# Patient Record
Sex: Female | Born: 1975 | Race: Black or African American | Hispanic: No | Marital: Single | Smoking: Never smoker
Health system: Southern US, Community
[De-identification: ages and names within clinical notes are randomized; demographics above are authoritative.]

## PROBLEM LIST (undated history)

## (undated) ENCOUNTER — Emergency Department (HOSPITAL_COMMUNITY): Admission: EM | Payer: BLUE CROSS/BLUE SHIELD | Source: Home / Self Care

## (undated) DIAGNOSIS — A599 Trichomoniasis, unspecified: Secondary | ICD-10-CM

## (undated) DIAGNOSIS — J45909 Unspecified asthma, uncomplicated: Secondary | ICD-10-CM

## (undated) HISTORY — PX: HERNIA REPAIR: SHX51

## (undated) HISTORY — PX: CHOLECYSTECTOMY: SHX55

---

## 2015-07-20 ENCOUNTER — Emergency Department (HOSPITAL_COMMUNITY): Payer: Self-pay

## 2015-07-20 ENCOUNTER — Encounter (HOSPITAL_COMMUNITY): Payer: Self-pay

## 2015-07-20 ENCOUNTER — Emergency Department (HOSPITAL_COMMUNITY)
Admission: EM | Admit: 2015-07-20 | Discharge: 2015-07-20 | Disposition: A | Discharge status: Home / Self Care | Payer: Self-pay | Attending: Emergency Medicine | Admitting: Emergency Medicine

## 2015-07-20 DIAGNOSIS — R079 Chest pain, unspecified: Secondary | ICD-10-CM | POA: Insufficient documentation

## 2015-07-20 DIAGNOSIS — R630 Anorexia: Secondary | ICD-10-CM | POA: Insufficient documentation

## 2015-07-20 DIAGNOSIS — J45901 Unspecified asthma with (acute) exacerbation: Secondary | ICD-10-CM | POA: Insufficient documentation

## 2015-07-20 HISTORY — DX: Unspecified asthma, uncomplicated: J45.909

## 2015-07-20 LAB — CBC
HCT: 38.9 % (ref 36.0–46.0)
Hemoglobin: 12.8 g/dL (ref 12.0–15.0)
MCH: 33.6 pg (ref 26.0–34.0)
MCHC: 32.9 g/dL (ref 30.0–36.0)
MCV: 102.1 fL — ABNORMAL HIGH (ref 78.0–100.0)
PLATELETS: 289 10*3/uL (ref 150–400)
RBC: 3.81 MIL/uL — AB (ref 3.87–5.11)
RDW: 12.7 % (ref 11.5–15.5)
WBC: 7.4 10*3/uL (ref 4.0–10.5)

## 2015-07-20 LAB — I-STAT TROPONIN, ED
TROPONIN I, POC: 0.01 ng/mL (ref 0.00–0.08)
Troponin i, poc: 0 ng/mL (ref 0.00–0.08)

## 2015-07-20 LAB — BASIC METABOLIC PANEL
Anion gap: 8 (ref 5–15)
BUN: 17 mg/dL (ref 6–20)
CALCIUM: 9.6 mg/dL (ref 8.9–10.3)
CO2: 26 mmol/L (ref 22–32)
CREATININE: 0.96 mg/dL (ref 0.44–1.00)
Chloride: 107 mmol/L (ref 101–111)
GFR calc non Af Amer: >60 mL/min (ref >60)
Glucose, Bld: 83 mg/dL (ref 65–99)
Potassium: 4.1 mmol/L (ref 3.5–5.1)
SODIUM: 141 mmol/L (ref 135–145)

## 2015-07-20 MED ORDER — ALBUTEROL SULFATE HFA 108 (90 BASE) MCG/ACT IN AERS
2.0000 | INHALATION_SPRAY | Freq: Once | RESPIRATORY_TRACT | Status: AC
  Administered 2015-07-20: 2 via RESPIRATORY_TRACT
  Filled 2015-07-20: qty 6.7

## 2015-07-20 MED ORDER — METOCLOPRAMIDE HCL 5 MG/ML IJ SOLN
10.0000 mg | Freq: Once | INTRAMUSCULAR | Status: AC
  Administered 2015-07-20: 10 mg via INTRAMUSCULAR
  Filled 2015-07-20: qty 2

## 2015-07-20 MED ORDER — HYDROCODONE-ACETAMINOPHEN 5-325 MG PO TABS
2.0000 | ORAL_TABLET | Freq: Once | ORAL | Status: AC
  Administered 2015-07-20: 2 via ORAL
  Filled 2015-07-20: qty 2

## 2015-07-20 MED ORDER — DEXAMETHASONE SODIUM PHOSPHATE 10 MG/ML IJ SOLN
8.0000 mg | Freq: Once | INTRAMUSCULAR | Status: AC
  Administered 2015-07-20: 8 mg via INTRAMUSCULAR
  Filled 2015-07-20: qty 1

## 2015-07-20 NOTE — ED Provider Notes (Signed)
CSN: [id number]     Arrival date & time 07/20/15  1804 History   First MD Initiated Contact with Patient 07/20/15 1846     Chief Complaint  Patient presents with  . Chest Pain     (Consider location/radiation/quality/duration/timing/severity/associated sxs/prior Treatment) HPI Comments: 39 year old female with no significant medical history presents with anterior bilateral chest pain no radiation since 3:00 constant sharp. No pleuritic component, no classic blood clot risk factors, asthma history she says this feels similar. No respiratory distress. Patient does not have albuterol at home. No cough fevers or chills. No cardiac history. No exertional symptoms or leg swelling. No diaphoresis. Patient has had nausea.  Patient is a 39 y.o. female presenting with chest pain. The history is provided by the patient.  Chest Pain Associated symptoms: nausea   Associated symptoms: no abdominal pain, no back pain, no fever, no headache, no shortness of breath and not vomiting     Past Medical History  Diagnosis Date  . Asthma    History reviewed. No pertinent past surgical history. History reviewed. No pertinent family history. Social History  Substance Use Topics  . Smoking status: Never Smoker   . Smokeless tobacco: None  . Alcohol Use: No   OB History    No data available     Review of Systems  Constitutional: Positive for appetite change. Negative for fever and chills.  HENT: Negative for congestion.   Eyes: Negative for visual disturbance.  Respiratory: Negative for shortness of breath.   Cardiovascular: Positive for chest pain.  Gastrointestinal: Positive for nausea. Negative for vomiting and abdominal pain.  Genitourinary: Negative for dysuria and flank pain.  Musculoskeletal: Negative for back pain, neck pain and neck stiffness.  Skin: Negative for rash.  Neurological: Negative for light-headedness and headaches.      Allergies  Bactrim; Flagyl; Morphine and related;  Prednisone; and Zofran  Home Medications   Prior to Admission medications   Not on File   BP 119/80 mmHg  Pulse 88  Temp(Src) 98.2 F (36.8 C) (Oral)  Resp 16  Ht 5\' 8"  (1.727 m)  Wt 194 lb 0.6 oz (88.016 kg)  BMI 29.51 kg/m2  SpO2 100%  LMP 07/14/2015 Physical Exam  Constitutional: She is oriented to person, place, and time. She appears well-developed and well-nourished.  HENT:  Head: Normocephalic and atraumatic.  Eyes: Conjunctivae are normal. Right eye exhibits no discharge. Left eye exhibits no discharge.  Neck: Normal range of motion. Neck supple. No tracheal deviation present.  Cardiovascular: Normal rate, regular rhythm and intact distal pulses.   Pulmonary/Chest: Effort normal. She has wheezes (and expose wheeze).  Abdominal: Soft. She exhibits no distension. There is no tenderness. There is no guarding.  Musculoskeletal: She exhibits no edema.  Neurological: She is alert and oriented to person, place, and time.  Skin: Skin is warm. No rash noted.  Psychiatric: She has a normal mood and affect.  Nursing note and vitals reviewed.   ED Course  Procedures (including critical care time) Labs Review Labs Reviewed  BASIC METABOLIC PANEL  [place], ED    Imaging Review Dg Chest 2 View  07/20/2015  CLINICAL DATA:  Stabbing chest pain under both breasts for 3 hours. Initial encounter. EXAM: CHEST  2 VIEW COMPARISON:  None. FINDINGS: The heart size and mediastinal contours are normal. The lungs are clear. There is no pleural effusion or pneumothorax. No acute osseous findings are identified. Old rib fractures are noted on the left. EKG snap  overlies the upper right chest. IMPRESSION: No active cardiopulmonary process. Electronically Signed   By: Richardean Sale M.D.   On: 07/20/2015 18:37   I have personally reviewed and evaluated these images and lab results as part of my medical decision-making.   EKG Interpretation   Date/Time:  Sunday July 20 2015 18:09:50 EST Ventricular Rate:  73 PR Interval:  122 QRS Duration: 84 QT Interval:  366 QTC Calculation: 403 R Axis:   68 Text Interpretation:  Normal sinus rhythm with sinus arrhythmia Normal ECG  Confirmed by Cristalle Rohm  MD, Vannessa Godown ([id number]) on 07/20/2015 6:58:58 PM      MDM   Final diagnoses:  Asthma exacerbation  Chest pain, unspecified chest pain type   Patient presents with bilateral chest pain she says feels similar to asthma history. With chest pain plan for cardiac screen, patient low risk plan for delta troponin. Patient nauseous and one episode of vomiting in the ER and antiemetics given. Albuterol, steroids. EKG no acute findings, chest x-ray no acute findings. Blood work pending. PERC neg Results and differential diagnosis were discussed with the patient/parent/guardian. Xrays were independently reviewed by myself.  Close follow up outpatient was discussed, comfortable with the plan.   Medications  dexamethasone (DECADRON) injection 8 mg (8 mg Intramuscular Given 07/20/15 1925)  albuterol (PROVENTIL HFA;VENTOLIN HFA) 108 (90 BASE) MCG/ACT inhaler 2 puff (2 puffs Inhalation Given 07/20/15 1925)  HYDROcodone-acetaminophen (NORCO/VICODIN) 5-325 MG per tablet 2 tablet (2 tablets Oral Given 07/20/15 1924)  metoCLOPramide (REGLAN) injection 10 mg (10 mg Intramuscular Given 07/20/15 2000)    Filed Vitals:   07/20/15 1814 07/20/15 1900  BP: 114/96 119/80  Pulse: 88   Temp: 98.2 F (36.8 C)   TempSrc: Oral   Resp: 16   Height: 5\' 8"  (1.727 m)   Weight: 194 lb 0.6 oz (88.016 kg)   SpO2: 100% 100%    Final diagnoses:  None          Elnora Morrison, MD 07/24/15 [phone]

## 2015-07-20 NOTE — ED Notes (Signed)
Onset 3pm chest pain underneath bilateral breast and upper mid back.  Pain staying the same, constant.  Nothing makes better or worse.  No resp distress noted at triage.

## 2015-07-20 NOTE — Discharge Instructions (Signed)
If you were given medicines take as directed.  If you are on coumadin or contraceptives realize their levels and effectiveness is altered by many different medicines.  If you have any reaction (rash, tongues swelling, other) to the medicines stop taking and see a physician.    If your blood pressure was elevated in the ER make sure you follow up for management with a primary doctor or return for chest pain, shortness of breath or stroke symptoms.  Please follow up as directed and return to the ER or see a physician for new or worsening symptoms.  Thank you. Filed Vitals:   07/20/15 2000 07/20/15 2030 07/20/15 2100 07/20/15 2130  BP: 128/88 121/66 120/89 126/84  Pulse: 76 66 104 67  Temp:      TempSrc:      Resp:   19 20  Height:      Weight:      SpO2: 100% 100% 95% 97%

## 2015-08-25 ENCOUNTER — Encounter (HOSPITAL_COMMUNITY): Payer: Self-pay | Admitting: Emergency Medicine

## 2015-08-25 DIAGNOSIS — J45909 Unspecified asthma, uncomplicated: Secondary | ICD-10-CM | POA: Insufficient documentation

## 2015-08-25 DIAGNOSIS — R109 Unspecified abdominal pain: Secondary | ICD-10-CM | POA: Insufficient documentation

## 2015-08-25 LAB — CBC WITH DIFFERENTIAL/PLATELET
BASOS ABS: 0 10*3/uL (ref 0.0–0.1)
Basophils Relative: 1 %
EOS ABS: 0.2 10*3/uL (ref 0.0–0.7)
Eosinophils Relative: 3 %
HCT: 36.7 % (ref 36.0–46.0)
Hemoglobin: 12.2 g/dL (ref 12.0–15.0)
LYMPHS ABS: 3.4 10*3/uL (ref 0.7–4.0)
Lymphocytes Relative: 44 %
MCH: 34 pg (ref 26.0–34.0)
MCHC: 33.2 g/dL (ref 30.0–36.0)
MCV: 102.2 fL — AB (ref 78.0–100.0)
MONO ABS: 0.5 10*3/uL (ref 0.1–1.0)
MONOS PCT: 6 %
NEUTROS PCT: 46 %
Neutro Abs: 3.5 10*3/uL (ref 1.7–7.7)
PLATELETS: 241 10*3/uL (ref 150–400)
RBC: 3.59 MIL/uL — ABNORMAL LOW (ref 3.87–5.11)
RDW: 12.8 % (ref 11.5–15.5)
WBC: 7.6 10*3/uL (ref 4.0–10.5)

## 2015-08-25 NOTE — ED Notes (Signed)
Pt with hx of R inguinal hernia repair. Pt c.o pain in same area x 2 days with some diarrhea. Denies urinary/vaginal sx.

## 2015-08-26 ENCOUNTER — Emergency Department (HOSPITAL_COMMUNITY)
Admission: EM | Admit: 2015-08-26 | Discharge: 2015-08-26 | Disposition: A | Discharge status: Home / Self Care | Payer: Self-pay | Attending: Emergency Medicine | Admitting: Emergency Medicine

## 2015-08-26 ENCOUNTER — Emergency Department (HOSPITAL_COMMUNITY)
Admission: EM | Admit: 2015-08-26 | Discharge: 2015-08-26 | Discharge status: Against Medical Advice | Payer: Self-pay | Attending: Emergency Medicine | Admitting: Emergency Medicine

## 2015-08-26 ENCOUNTER — Emergency Department (HOSPITAL_COMMUNITY): Payer: Self-pay

## 2015-08-26 ENCOUNTER — Encounter (HOSPITAL_COMMUNITY): Payer: Self-pay | Admitting: Emergency Medicine

## 2015-08-26 DIAGNOSIS — J45909 Unspecified asthma, uncomplicated: Secondary | ICD-10-CM | POA: Insufficient documentation

## 2015-08-26 DIAGNOSIS — R109 Unspecified abdominal pain: Secondary | ICD-10-CM

## 2015-08-26 DIAGNOSIS — R197 Diarrhea, unspecified: Secondary | ICD-10-CM | POA: Insufficient documentation

## 2015-08-26 DIAGNOSIS — R1031 Right lower quadrant pain: Secondary | ICD-10-CM | POA: Insufficient documentation

## 2015-08-26 DIAGNOSIS — Z79899 Other long term (current) drug therapy: Secondary | ICD-10-CM | POA: Insufficient documentation

## 2015-08-26 DIAGNOSIS — R112 Nausea with vomiting, unspecified: Secondary | ICD-10-CM

## 2015-08-26 LAB — URINE MICROSCOPIC-ADD ON: WBC UA: NONE SEEN WBC/hpf (ref 0–5)

## 2015-08-26 LAB — COMPREHENSIVE METABOLIC PANEL
ALT: 10 U/L — AB (ref 14–54)
ANION GAP: 8 (ref 5–15)
AST: 17 U/L (ref 15–41)
Albumin: 3.6 g/dL (ref 3.5–5.0)
Alkaline Phosphatase: 57 U/L (ref 38–126)
BUN: 15 mg/dL (ref 6–20)
CALCIUM: 9.3 mg/dL (ref 8.9–10.3)
CHLORIDE: 108 mmol/L (ref 101–111)
CO2: 24 mmol/L (ref 22–32)
CREATININE: 0.96 mg/dL (ref 0.44–1.00)
Glucose, Bld: 85 mg/dL (ref 65–99)
Potassium: 4.2 mmol/L (ref 3.5–5.1)
Sodium: 140 mmol/L (ref 135–145)
Total Bilirubin: 0.4 mg/dL (ref 0.3–1.2)
Total Protein: 6.2 g/dL — ABNORMAL LOW (ref 6.5–8.1)

## 2015-08-26 LAB — URINALYSIS, ROUTINE W REFLEX MICROSCOPIC
BILIRUBIN URINE: NEGATIVE
Glucose, UA: NEGATIVE mg/dL
KETONES UR: NEGATIVE mg/dL
LEUKOCYTES UA: NEGATIVE
NITRITE: NEGATIVE
Protein, ur: NEGATIVE mg/dL
SPECIFIC GRAVITY, URINE: 1.03 (ref 1.005–1.030)
pH: 6 (ref 5.0–8.0)

## 2015-08-26 LAB — POC URINE PREG, ED: PREG TEST UR: NEGATIVE

## 2015-08-26 MED ORDER — PROMETHAZINE HCL 25 MG PO TABS: 25.0000 mg | ORAL_TABLET | Freq: Four times a day (QID) | ORAL | Status: DC | PRN

## 2015-08-26 MED ORDER — IOHEXOL 300 MG/ML  SOLN
100.0000 mL | Freq: Once | INTRAMUSCULAR | Status: AC | PRN
  Administered 2015-08-26: 100 mL via INTRAVENOUS

## 2015-08-26 MED ORDER — IOHEXOL 300 MG/ML  SOLN
25.0000 mL | Freq: Once | INTRAMUSCULAR | Status: AC | PRN
  Administered 2015-08-26: 25 mL via ORAL

## 2015-08-26 MED ORDER — SODIUM CHLORIDE 0.9 % IV BOLUS (SEPSIS)
500.0000 mL | Freq: Once | INTRAVENOUS | Status: AC
  Administered 2015-08-26: 500 mL via INTRAVENOUS

## 2015-08-26 MED ORDER — PROMETHAZINE HCL 25 MG/ML IJ SOLN
12.5000 mg | Freq: Once | INTRAMUSCULAR | Status: AC
  Administered 2015-08-26: 12.5 mg via INTRAVENOUS
  Filled 2015-08-26: qty 1

## 2015-08-26 NOTE — ED Notes (Signed)
Patient transported to CT 

## 2015-08-26 NOTE — ED Notes (Signed)
Bed: [id number] Expected date:  Expected time:  Means of arrival:  Comments: From [facility], agitation

## 2015-08-26 NOTE — ED Notes (Signed)
Pt was at [facility] earlier tonight and states they did blood work on her while she was there

## 2015-08-26 NOTE — Discharge Instructions (Signed)
Abdominal Pain, Adult Many things can cause abdominal pain. Usually, abdominal pain is not caused by a disease and will improve without treatment. It can often be observed and treated at home. Your health care provider will do a physical exam and possibly order blood tests and X-rays to help determine the seriousness of your pain. However, in many cases, more time must pass before a clear cause of the pain can be found. Before that point, your health care provider may not know if you need more testing or further treatment. HOME CARE INSTRUCTIONS Monitor your abdominal pain for any changes. The following actions may help to alleviate any discomfort you are experiencing:  Only take over-the-counter or prescription medicines as directed by your health care provider.  Do not take laxatives unless directed to do so by your health care provider.  Try a clear liquid diet (broth, tea, or water) as directed by your health care provider. Slowly move to a bland diet as tolerated. SEEK MEDICAL CARE IF:  You have unexplained abdominal pain.  You have abdominal pain associated with nausea or diarrhea.  You have pain when you urinate or have a bowel movement.  You experience abdominal pain that wakes you in the night.  You have abdominal pain that is worsened or improved by eating food.  You have abdominal pain that is worsened with eating fatty foods.  You have a fever. SEEK IMMEDIATE MEDICAL CARE IF:  Your pain does not go away within 2 hours.  You keep throwing up (vomiting).  Your pain is felt only in portions of the abdomen, such as the right side or the left lower portion of the abdomen.  You pass bloody or black tarry stools. MAKE SURE YOU:  Understand these instructions.  Will watch your condition.  Will get help right away if you are not doing well or get worse.   This information is not intended to replace advice given to you by your health care provider. Make sure you discuss  any questions you have with your health care provider.   Document Released: 05/26/2005 Document Revised: 05/07/2015 Document Reviewed: 04/25/2013 Elsevier Interactive Patient Education 2016 [place].  Diarrhea Diarrhea is frequent loose and watery bowel movements. It can cause you to feel weak and dehydrated. Dehydration can cause you to become tired and thirsty, have a dry mouth, and have decreased urination that often is dark yellow. Diarrhea is a sign of another problem, most often an infection that will not last long. In most cases, diarrhea typically lasts 2-3 days. However, it can last longer if it is a sign of something more serious. It is important to treat your diarrhea as directed by your caregiver to lessen or prevent future episodes of diarrhea. CAUSES  Some common causes include:  Gastrointestinal infections caused by viruses, bacteria, or parasites.  Food poisoning or food allergies.  Certain medicines, such as antibiotics, chemotherapy, and laxatives.  Artificial sweeteners and fructose.  Digestive disorders. HOME CARE INSTRUCTIONS  Ensure adequate fluid intake (hydration): Have 1 cup (8 oz) of fluid for each diarrhea episode. Avoid fluids that contain simple sugars or sports drinks, fruit juices, whole milk products, and sodas. Your urine should be clear or pale yellow if you are drinking enough fluids. Hydrate with an oral rehydration solution that you can purchase at pharmacies, retail stores, and online. You can prepare an oral rehydration solution at home by mixing the following ingredients together:   - tsp table salt.   tsp baking soda.  tsp salt substitute containing potassium chloride.  1  tablespoons sugar.  1 L (34 oz) of water.  Certain foods and beverages may increase the speed at which food moves through the gastrointestinal (GI) tract. These foods and beverages should be avoided and include:  Caffeinated and alcoholic beverages.  High-fiber  foods, such as raw fruits and vegetables, nuts, seeds, and whole grain breads and cereals.  Foods and beverages sweetened with sugar alcohols, such as xylitol, sorbitol, and mannitol.  Some foods may be well tolerated and may help thicken stool including:  Starchy foods, such as rice, toast, pasta, low-sugar cereal, oatmeal, grits, baked potatoes, crackers, and bagels.  Bananas.  Applesauce.  Add probiotic-rich foods to help increase healthy bacteria in the GI tract, such as yogurt and fermented milk products.  Wash your hands well after each diarrhea episode.  Only take over-the-counter or prescription medicines as directed by your caregiver.  Take a warm bath to relieve any burning or pain from frequent diarrhea episodes. SEEK IMMEDIATE MEDICAL CARE IF:   You are unable to keep fluids down.  You have persistent vomiting.  You have blood in your stool, or your stools are black and tarry.  You do not urinate in 6-8 hours, or there is only a small amount of very dark urine.  You have abdominal pain that increases or localizes.  You have weakness, dizziness, confusion, or light-headedness.  You have a severe headache.  Your diarrhea gets worse or does not get better.  You have a fever or persistent symptoms for more than 2-3 days.  You have a fever and your symptoms suddenly get worse. MAKE SURE YOU:   Understand these instructions.  Will watch your condition.  Will get help right away if you are not doing well or get worse.   This information is not intended to replace advice given to you by your health care provider. Make sure you discuss any questions you have with your health care provider.   Document Released: 08/06/2002 Document Revised: 09/06/2014 Document Reviewed: 04/23/2012 Elsevier Interactive Patient Education 2016 Elsevier Inc.  Nausea and Vomiting Nausea is a sick feeling that often comes before throwing up (vomiting). Vomiting is a reflex where  stomach contents come out of your mouth. Vomiting can cause severe loss of body fluids (dehydration). Children and elderly adults can become dehydrated quickly, especially if they also have diarrhea. Nausea and vomiting are symptoms of a condition or disease. It is important to find the cause of your symptoms. CAUSES   Direct irritation of the stomach lining. This irritation can result from increased acid production (gastroesophageal reflux disease), infection, food poisoning, taking certain medicines (such as nonsteroidal anti-inflammatory drugs), alcohol use, or tobacco use.  Signals from the brain.These signals could be caused by a headache, heat exposure, an inner ear disturbance, increased pressure in the brain from injury, infection, a tumor, or a concussion, pain, emotional stimulus, or metabolic problems.  An obstruction in the gastrointestinal tract (bowel obstruction).  Illnesses such as diabetes, hepatitis, gallbladder problems, appendicitis, kidney problems, cancer, sepsis, atypical symptoms of a heart attack, or eating disorders.  Medical treatments such as chemotherapy and radiation.  Receiving medicine that makes you sleep (general anesthetic) during surgery. DIAGNOSIS Your caregiver may ask for tests to be done if the problems do not improve after a few days. Tests may also be done if symptoms are severe or if the reason for the nausea and vomiting is not clear. Tests may include:  Urine tests.  Blood tests.  Stool tests.  Cultures (to look for evidence of infection).  X-rays or other imaging studies. Test results can help your caregiver make decisions about treatment or the need for additional tests. TREATMENT You need to stay well hydrated. Drink frequently but in small amounts.You may wish to drink water, sports drinks, clear broth, or eat frozen ice pops or gelatin dessert to help stay hydrated.When you eat, eating slowly may help prevent nausea.There are also some  antinausea medicines that may help prevent nausea. HOME CARE INSTRUCTIONS   Take all medicine as directed by your caregiver.  If you do not have an appetite, do not force yourself to eat. However, you must continue to drink fluids.  If you have an appetite, eat a normal diet unless your caregiver tells you differently.  Eat a variety of complex carbohydrates (rice, wheat, potatoes, bread), lean meats, yogurt, fruits, and vegetables.  Avoid high-fat foods because they are more difficult to digest.  Drink enough water and fluids to keep your urine clear or pale yellow.  If you are dehydrated, ask your caregiver for specific rehydration instructions. Signs of dehydration may include:  Severe thirst.  Dry lips and mouth.  Dizziness.  Dark urine.  Decreasing urine frequency and amount.  Confusion.  Rapid breathing or pulse. SEEK IMMEDIATE MEDICAL CARE IF:   You have blood or brown flecks (like coffee grounds) in your vomit.  You have black or bloody stools.  You have a severe headache or stiff neck.  You are confused.  You have severe abdominal pain.  You have chest pain or trouble breathing.  You do not urinate at least once every 8 hours.  You develop cold or clammy skin.  You continue to vomit for longer than 24 to 48 hours.  You have a fever. MAKE SURE YOU:   Understand these instructions.  Will watch your condition.  Will get help right away if you are not doing well or get worse.   This information is not intended to replace advice given to you by your health care provider. Make sure you discuss any questions you have with your health care provider.   Document Released: 08/16/2005 Document Revised: 11/08/2011 Document Reviewed: 01/13/2011 Elsevier Interactive Patient Education [employer].

## 2015-08-26 NOTE — ED Provider Notes (Signed)
CSN: [id number]     Arrival date & time 08/26/15  0056 History  By signing my name below, I, Emmanuella Mensah, attest that this documentation has been prepared under the direction and in the presence of Davonna Belling, MD. Electronically Signed: Judithann Sauger, ED Scribe. 08/26/2015. 2:03 AM.    Chief Complaint  Patient presents with  . Abdominal Pain  . Diarrhea   The history is provided by the patient. No language interpreter was used.   HPI Comments: Kelsey Maynard is a 39 y.o. female who presents to the Emergency Department complaining of gradually worsening intermittent abdominal pain that has become constant onset 2 days ago. She reports associated nausea and an episode of diarrhea yesterday. She adds that she has not been able to eat or drink anything today. She states that she had a hernia repair several years ago and she denies any problems at that area since. She states that her last menstrual cycle was 2 weeks and recently, there have been sporadic and frequent. She denies any sick contacts. Pt was at [facility] earlier tonight where she had blood work done. She reports an allergy to Morphine, Zofran, bactrim, flagyl, and prednisone. She denies any vomiting, vaginal discharge, or vaginal bleeding.   Past Medical History  Diagnosis Date  . Asthma    Past Surgical History  Procedure Laterality Date  . Hernia repair     History reviewed. No pertinent family history. Social History  Substance Use Topics  . Smoking status: Never Smoker   . Smokeless tobacco: None  . Alcohol Use: No   OB History    No data available     Review of Systems  Constitutional: Negative for fever.  Gastrointestinal: Positive for nausea, abdominal pain and diarrhea. Negative for vomiting.  Genitourinary: Negative for vaginal bleeding and vaginal discharge.  All other systems reviewed and are negative.     Allergies  Bactrim; Flagyl; Morphine and related; Prednisone; and Zofran  Home  Medications   Prior to Admission medications   Medication Sig Start Date End Date Taking? Authorizing Provider  albuterol (PROVENTIL HFA;VENTOLIN HFA) 108 (90 BASE) MCG/ACT inhaler Inhale 1 puff into the lungs every 6 (six) hours as needed for wheezing or shortness of breath.   Yes Historical Provider, MD  promethazine (PHENERGAN) 25 MG tablet Take 1 tablet (25 mg total) by mouth every 6 (six) hours as needed for nausea. 08/26/15   Davonna Belling, MD   BP 116/77 mmHg  Pulse 57  Temp(Src) 97.7 F (36.5 C) (Oral)  Resp 16  SpO2 100%  LMP 08/06/2015 Physical Exam  Constitutional: She is oriented to person, place, and time. She appears well-developed and well-nourished. No distress.  HENT:  Head: Normocephalic and atraumatic.  Eyes: Conjunctivae and EOM are normal.  Neck: Neck supple. No tracheal deviation present.  Cardiovascular: Normal rate.   Pulmonary/Chest: Effort normal. No respiratory distress.  Abdominal: She exhibits no mass. There is tenderness. There is no rebound and no guarding.  Right lower quadrant tenderness  Musculoskeletal: Normal range of motion.  Neurological: She is alert and oriented to person, place, and time.  Skin: Skin is warm and dry.  Psychiatric: She has a normal mood and affect. Her behavior is normal.  Nursing note and vitals reviewed.   ED Course  Procedures (including critical care time) DIAGNOSTIC STUDIES: Oxygen Saturation is 98% on RA, normal by my interpretation.    COORDINATION OF CARE: 2:01 AM- Pt advised of plan for treatment and pt agrees. Pt  will receive a CAT scan for further evaluation.    Labs Review Labs Reviewed - No data to display  Imaging Review Ct Abdomen Pelvis W Contrast  08/26/2015  CLINICAL DATA:  Acute onset of right lower quadrant abdominal pain and nausea. Diarrhea. Irregular periods. Initial encounter. EXAM: CT ABDOMEN AND PELVIS WITH CONTRAST TECHNIQUE: Multidetector CT imaging of the abdomen and pelvis was  performed using the standard protocol following bolus administration of intravenous contrast. CONTRAST:  155mL OMNIPAQUE IOHEXOL 300 MG/ML  SOLN COMPARISON:  None. FINDINGS: The visualized lung bases are clear. A tiny nonspecific 6 mm hypodensity is noted within the right hepatic lobe. The spleen is unremarkable in appearance. The patient is status post cholecystectomy, with clips noted at the gallbladder fossa. The pancreas and adrenal glands are unremarkable. The kidneys are unremarkable in appearance. There is no evidence of hydronephrosis. No renal or ureteral stones are seen. No perinephric stranding is appreciated. No free fluid is identified. The small bowel is unremarkable in appearance. 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 The stomach is within normal limits. No acute vascular abnormalities are seen. The appendix is normal in caliber and contains air, without evidence of appendicitis. Minimal diverticulosis is noted along the descending colon. The colon is otherwise unremarkable. The bladder is mildly distended and grossly unremarkable. The uterus is unremarkable in appearance. The ovaries are relatively symmetric. No suspicious adnexal masses are seen. No inguinal lymphadenopathy is seen. No acute osseous abnormalities are identified. IMPRESSION: 1. No acute abnormality seen within the abdomen or pelvis. 2. Minimal diverticulosis along the descending colon. Colon otherwise unremarkable. 3. Tiny nonspecific 6 mm hypodensity within the right hepatic lobe. 4. 1.8 cm duodenal diverticulum incidentally noted at the pancreatic head. Electronically Signed   By: Garald Balding M.D.   On: 08/26/2015 03:58     Davonna Belling, MD has personally reviewed and evaluated these images and lab results as part of his medical decision-making.  MDM   Final diagnoses:  Abdominal pain, unspecified abdominal location  Nausea vomiting and diarrhea    . Patient with  abdominal pain. Nausea vomiting diarrhea. Right lower quadrant tenderness. CT scan reassuring. Feels better will be discharged home. I personally performed the services described in this documentation, which was scribed in my presence. The recorded information has been reviewed and is accurate.      Davonna Belling, MD 08/26/15 [phone]

## 2015-08-26 NOTE — ED Notes (Signed)
Pt is c/o abd pain and diarrhea for the past 2 days  Pt states she has pain in her abdomen like something is playing tug of war and then has sharp pains like someone is stabbing her  Pt states pain is worse if she bends over or lays flat

## 2015-09-07 ENCOUNTER — Emergency Department (HOSPITAL_COMMUNITY)
Admission: EM | Admit: 2015-09-07 | Discharge: 2015-09-07 | Discharge status: Against Medical Advice | Payer: Self-pay | Attending: Emergency Medicine | Admitting: Emergency Medicine

## 2015-09-07 ENCOUNTER — Encounter (HOSPITAL_COMMUNITY): Payer: Self-pay | Admitting: *Deleted

## 2015-09-07 DIAGNOSIS — R197 Diarrhea, unspecified: Secondary | ICD-10-CM | POA: Insufficient documentation

## 2015-09-07 DIAGNOSIS — J45909 Unspecified asthma, uncomplicated: Secondary | ICD-10-CM | POA: Insufficient documentation

## 2015-09-07 DIAGNOSIS — Z3202 Encounter for pregnancy test, result negative: Secondary | ICD-10-CM | POA: Insufficient documentation

## 2015-09-07 DIAGNOSIS — N939 Abnormal uterine and vaginal bleeding, unspecified: Secondary | ICD-10-CM | POA: Insufficient documentation

## 2015-09-07 DIAGNOSIS — R1030 Lower abdominal pain, unspecified: Secondary | ICD-10-CM | POA: Insufficient documentation

## 2015-09-07 LAB — URINALYSIS, ROUTINE W REFLEX MICROSCOPIC
Bilirubin Urine: NEGATIVE
Glucose, UA: NEGATIVE mg/dL
Ketones, ur: NEGATIVE mg/dL
LEUKOCYTES UA: NEGATIVE
NITRITE: NEGATIVE
PROTEIN: NEGATIVE mg/dL
SPECIFIC GRAVITY, URINE: 1.027 (ref 1.005–1.030)
pH: 6 (ref 5.0–8.0)

## 2015-09-07 LAB — CBC
HCT: 45 % (ref 36.0–46.0)
HEMOGLOBIN: 15 g/dL (ref 12.0–15.0)
MCH: 34.6 pg — AB (ref 26.0–34.0)
MCHC: 33.3 g/dL (ref 30.0–36.0)
MCV: 103.9 fL — ABNORMAL HIGH (ref 78.0–100.0)
PLATELETS: 268 10*3/uL (ref 150–400)
RBC: 4.33 MIL/uL (ref 3.87–5.11)
RDW: 12.5 % (ref 11.5–15.5)
WBC: 7.4 10*3/uL (ref 4.0–10.5)

## 2015-09-07 LAB — URINE MICROSCOPIC-ADD ON

## 2015-09-07 LAB — COMPREHENSIVE METABOLIC PANEL
ALK PHOS: 73 U/L (ref 38–126)
ALT: 16 U/L (ref 14–54)
ANION GAP: 11 (ref 5–15)
AST: 24 U/L (ref 15–41)
Albumin: 4.5 g/dL (ref 3.5–5.0)
BILIRUBIN TOTAL: 0.6 mg/dL (ref 0.3–1.2)
BUN: 16 mg/dL (ref 6–20)
CALCIUM: 9.6 mg/dL (ref 8.9–10.3)
CO2: 22 mmol/L (ref 22–32)
CREATININE: 0.97 mg/dL (ref 0.44–1.00)
Chloride: 107 mmol/L (ref 101–111)
Glucose, Bld: 84 mg/dL (ref 65–99)
Potassium: 3.8 mmol/L (ref 3.5–5.1)
Sodium: 140 mmol/L (ref 135–145)
TOTAL PROTEIN: 7.8 g/dL (ref 6.5–8.1)

## 2015-09-07 LAB — POC URINE PREG, ED: Preg Test, Ur: NEGATIVE

## 2015-09-07 LAB — LIPASE, BLOOD: Lipase: 29 U/L (ref 11–51)

## 2015-09-07 NOTE — ED Notes (Signed)
Pt states she would like to leave AMA.  It was explained to that Dr. Roxanne Mins was signed up to see her and would likely be in within the next 10 minutes. Pt was also made aware of the risk of leaving.  Pt expressed understanding and still desired to leave AMA.  Dr. Roxanne Mins made aware.

## 2015-09-07 NOTE — ED Notes (Addendum)
Pt complains of lower abdominal pain and diarrhea since last night. Pt had a miscarriage, then dilation and curettage 3 weeks ago. Pt states she started bleeding lightly last night, but states her period is due at the end of this month. Pt states she took ibuprofen, which did not provide relief. Pt states she has also been urinating more frequently, denies odor or burning.

## 2015-10-01 ENCOUNTER — Emergency Department (HOSPITAL_COMMUNITY)
Admission: EM | Admit: 2015-10-01 | Discharge: 2015-10-02 | Disposition: A | Discharge status: Home / Self Care | Payer: Self-pay | Attending: Emergency Medicine | Admitting: Emergency Medicine

## 2015-10-01 ENCOUNTER — Encounter (HOSPITAL_COMMUNITY): Payer: Self-pay | Admitting: Emergency Medicine

## 2015-10-01 DIAGNOSIS — R1110 Vomiting, unspecified: Secondary | ICD-10-CM | POA: Insufficient documentation

## 2015-10-01 DIAGNOSIS — N898 Other specified noninflammatory disorders of vagina: Secondary | ICD-10-CM | POA: Insufficient documentation

## 2015-10-01 DIAGNOSIS — R197 Diarrhea, unspecified: Secondary | ICD-10-CM | POA: Insufficient documentation

## 2015-10-01 DIAGNOSIS — J45909 Unspecified asthma, uncomplicated: Secondary | ICD-10-CM | POA: Insufficient documentation

## 2015-10-01 LAB — CBC WITH DIFFERENTIAL/PLATELET
Basophils Absolute: 0 10*3/uL (ref 0.0–0.1)
Basophils Relative: 1 %
EOS ABS: 0.1 10*3/uL (ref 0.0–0.7)
EOS PCT: 2 %
HEMATOCRIT: 40.2 % (ref 36.0–46.0)
Hemoglobin: 13.5 g/dL (ref 12.0–15.0)
Lymphocytes Relative: 41 %
Lymphs Abs: 2.5 10*3/uL (ref 0.7–4.0)
MCH: 33.8 pg (ref 26.0–34.0)
MCHC: 33.6 g/dL (ref 30.0–36.0)
MCV: 100.8 fL — ABNORMAL HIGH (ref 78.0–100.0)
MONO ABS: 0.3 10*3/uL (ref 0.1–1.0)
MONOS PCT: 5 %
Neutro Abs: 3 10*3/uL (ref 1.7–7.7)
Neutrophils Relative %: 51 %
Platelets: 235 10*3/uL (ref 150–400)
RBC: 3.99 MIL/uL (ref 3.87–5.11)
RDW: 12.3 % (ref 11.5–15.5)
WBC: 6 10*3/uL (ref 4.0–10.5)

## 2015-10-01 LAB — COMPREHENSIVE METABOLIC PANEL
ALT: 14 U/L (ref 14–54)
AST: 23 U/L (ref 15–41)
Albumin: 3.9 g/dL (ref 3.5–5.0)
Alkaline Phosphatase: 67 U/L (ref 38–126)
Anion gap: 8 (ref 5–15)
BILIRUBIN TOTAL: 0.4 mg/dL (ref 0.3–1.2)
BUN: 15 mg/dL (ref 6–20)
CALCIUM: 9.3 mg/dL (ref 8.9–10.3)
CO2: 22 mmol/L (ref 22–32)
CREATININE: 0.93 mg/dL (ref 0.44–1.00)
Chloride: 110 mmol/L (ref 101–111)
GFR calc Af Amer: >60 mL/min (ref >60)
Glucose, Bld: 117 mg/dL — ABNORMAL HIGH (ref 65–99)
Potassium: 4.2 mmol/L (ref 3.5–5.1)
Sodium: 140 mmol/L (ref 135–145)
TOTAL PROTEIN: 6.9 g/dL (ref 6.5–8.1)

## 2015-10-01 LAB — LIPASE, BLOOD: Lipase: 37 U/L (ref 11–51)

## 2015-10-01 LAB — I-STAT BETA HCG BLOOD, ED (MC, WL, AP ONLY)

## 2015-10-01 NOTE — ED Notes (Signed)
Pt came to desk and stated that she wanted to leave. When asked if she was sure she wanted to leave pt just walked out.

## 2015-10-01 NOTE — ED Notes (Signed)
Pt reports lower abdominal pain, thick white vaginal discharge that has an odor for four days. Three days of diarrhea and vomiting. Pt reports emesis X 5 in the past 24 hours. Pt reports unable to drink fluids today without feeling nauseated.  LMP second week of January. Pt reports her period was irregular last time.

## 2015-10-30 ENCOUNTER — Emergency Department (HOSPITAL_COMMUNITY)
Admission: EM | Admit: 2015-10-30 | Discharge: 2015-10-30 | Disposition: A | Discharge status: Home / Self Care | Payer: Self-pay | Attending: Emergency Medicine | Admitting: Emergency Medicine

## 2015-10-30 ENCOUNTER — Encounter (HOSPITAL_COMMUNITY): Payer: Self-pay | Admitting: Emergency Medicine

## 2015-10-30 DIAGNOSIS — R112 Nausea with vomiting, unspecified: Secondary | ICD-10-CM | POA: Insufficient documentation

## 2015-10-30 DIAGNOSIS — Z3202 Encounter for pregnancy test, result negative: Secondary | ICD-10-CM | POA: Insufficient documentation

## 2015-10-30 DIAGNOSIS — J45909 Unspecified asthma, uncomplicated: Secondary | ICD-10-CM | POA: Insufficient documentation

## 2015-10-30 LAB — URINALYSIS, ROUTINE W REFLEX MICROSCOPIC
BILIRUBIN URINE: NEGATIVE
GLUCOSE, UA: NEGATIVE mg/dL
Ketones, ur: NEGATIVE mg/dL
Leukocytes, UA: NEGATIVE
Nitrite: NEGATIVE
PH: 5.5 (ref 5.0–8.0)
Protein, ur: NEGATIVE mg/dL
SPECIFIC GRAVITY, URINE: 1.022 (ref 1.005–1.030)

## 2015-10-30 LAB — COMPREHENSIVE METABOLIC PANEL
ALBUMIN: 3.7 g/dL (ref 3.5–5.0)
ALT: 18 U/L (ref 14–54)
AST: 27 U/L (ref 15–41)
Alkaline Phosphatase: 60 U/L (ref 38–126)
Anion gap: 7 (ref 5–15)
BUN: 13 mg/dL (ref 6–20)
CALCIUM: 9 mg/dL (ref 8.9–10.3)
CHLORIDE: 111 mmol/L (ref 101–111)
CO2: 22 mmol/L (ref 22–32)
Creatinine, Ser: 0.96 mg/dL (ref 0.44–1.00)
GFR calc Af Amer: >60 mL/min (ref >60)
GFR calc non Af Amer: >60 mL/min (ref >60)
GLUCOSE: 86 mg/dL (ref 65–99)
Potassium: 4.8 mmol/L (ref 3.5–5.1)
Sodium: 140 mmol/L (ref 135–145)
Total Bilirubin: 1.1 mg/dL (ref 0.3–1.2)
Total Protein: 6.6 g/dL (ref 6.5–8.1)

## 2015-10-30 LAB — LIPASE, BLOOD: LIPASE: 35 U/L (ref 11–51)

## 2015-10-30 LAB — I-STAT BETA HCG BLOOD, ED (MC, WL, AP ONLY): I-stat hCG, quantitative: <5 m[IU]/mL (ref <5)

## 2015-10-30 LAB — CBC
HEMATOCRIT: 39.1 % (ref 36.0–46.0)
HEMOGLOBIN: 13.3 g/dL (ref 12.0–15.0)
MCH: 34.3 pg — AB (ref 26.0–34.0)
MCHC: 34 g/dL (ref 30.0–36.0)
MCV: 100.8 fL — AB (ref 78.0–100.0)
Platelets: 241 10*3/uL (ref 150–400)
RBC: 3.88 MIL/uL (ref 3.87–5.11)
RDW: 12.6 % (ref 11.5–15.5)
WBC: 6.2 10*3/uL (ref 4.0–10.5)

## 2015-10-30 LAB — URINE MICROSCOPIC-ADD ON

## 2015-10-30 MED ORDER — SODIUM CHLORIDE 0.9 % IV BOLUS (SEPSIS)
1000.0000 mL | Freq: Once | INTRAVENOUS | Status: AC
  Administered 2015-10-30: 1000 mL via INTRAVENOUS

## 2015-10-30 MED ORDER — PROMETHAZINE HCL 25 MG/ML IJ SOLN
25.0000 mg | Freq: Once | INTRAMUSCULAR | Status: AC
  Administered 2015-10-30: 25 mg via INTRAVENOUS
  Filled 2015-10-30: qty 1

## 2015-10-30 MED ORDER — PROMETHAZINE HCL 25 MG/ML IJ SOLN: 12.5000 mg | Freq: Once | INTRAMUSCULAR | Status: DC

## 2015-10-30 MED ORDER — PROMETHAZINE HCL 25 MG PO TABS: 25.0000 mg | ORAL_TABLET | Freq: Four times a day (QID) | ORAL | Status: DC | PRN

## 2015-10-30 NOTE — ED Notes (Signed)
Pt from home with c/o emesis 3-4 times a day since this past Sunday.  Pt states she is unable to tolerate PO intake.  Denies abdominal pain or diarrhea.  NAD, A&O.

## 2015-10-30 NOTE — ED Provider Notes (Signed)
CSN: [id number]     Arrival date & time 10/30/15  [id number] History   First MD Initiated Contact with Patient 10/30/15 0930     Chief Complaint  Patient presents with  . Emesis    HPI   Kelsey Maynard is a 40 y.o. female with a PMH of asthma who presents to the ED with vomiting, which she states started on Monday. She reports she has been vomiting 3-4 times per day. She denies hematemesis. She reports eating or drinking precipitates her symptoms. She has not tried anything for symptom relief. She denies fever, chills, abdominal pain, diarrhea, constipation, dysuria, urgency, frequency.   Past Medical History  Diagnosis Date  . Asthma    Past Surgical History  Procedure Laterality Date  . Hernia repair     History reviewed. No pertinent family history. Social History  Substance Use Topics  . Smoking status: Never Smoker   . Smokeless tobacco: Never Used  . Alcohol Use: No   OB History    No data available      Review of Systems  Constitutional: Negative for fever and chills.  Gastrointestinal: Positive for nausea and vomiting. Negative for abdominal pain, diarrhea and constipation.  Genitourinary: Negative for dysuria, urgency and frequency.  All other systems reviewed and are negative.     Allergies  Bactrim; Flagyl; Morphine and related; Prednisone; and Zofran  Home Medications   Prior to Admission medications   Medication Sig Start Date End Date Taking? Authorizing Provider  albuterol (PROVENTIL HFA;VENTOLIN HFA) 108 (90 BASE) MCG/ACT inhaler Inhale 1 puff into the lungs every 6 (six) hours as needed for wheezing or shortness of breath.   Yes Historical Provider, MD  promethazine (PHENERGAN) 25 MG tablet Take 1 tablet (25 mg total) by mouth every 6 (six) hours as needed for nausea or vomiting. 10/30/15   Marella Chimes, PA-C    BP 121/71 mmHg  Pulse 70  Temp(Src) 98.1 F (36.7 C) (Oral)  Resp 18  Ht 5\' 8"  (1.727 m)  Wt 89.359 kg  BMI 29.96 kg/m2  SpO2 100%   LMP 10/30/2015 (Exact Date) Physical Exam  Constitutional: She is oriented to person, place, and time. She appears well-developed and well-nourished. No distress.  HENT:  Head: Normocephalic and atraumatic.  Right Ear: External ear normal.  Left Ear: External ear normal.  Nose: Nose normal.  Mouth/Throat: Uvula is midline, oropharynx is clear and moist and mucous membranes are normal.  Eyes: Conjunctivae, EOM and lids are normal. Pupils are equal, round, and reactive to light. Right eye exhibits no discharge. Left eye exhibits no discharge. No scleral icterus.  Neck: Normal range of motion. Neck supple.  Cardiovascular: Normal rate, regular rhythm, normal heart sounds, intact distal pulses and normal pulses.   Pulmonary/Chest: Effort normal and breath sounds normal. No respiratory distress. She has no wheezes. She has no rales.  Abdominal: Soft. Normal appearance and bowel sounds are normal. She exhibits no distension and no mass. There is no tenderness. There is no rigidity, no rebound, no guarding and no CVA tenderness.  Musculoskeletal: Normal range of motion. She exhibits no edema or tenderness.  Neurological: She is alert and oriented to person, place, and time.  Skin: Skin is warm, dry and intact. No rash noted. She is not diaphoretic. No erythema. No pallor.  Psychiatric: She has a normal mood and affect. Her speech is normal and behavior is normal.  Nursing note and vitals reviewed.   ED Course  Procedures (including critical care  time)  Labs Review Labs Reviewed  CBC - Abnormal; Notable for the following:    MCV 100.8 (*)    MCH 34.3 (*)    All other components within normal limits  URINALYSIS, ROUTINE W REFLEX MICROSCOPIC (NOT AT [facility]) - Abnormal; Notable for the following:    APPearance HAZY (*)    Hgb urine dipstick LARGE (*)    All other components within normal limits  URINE MICROSCOPIC-ADD ON - Abnormal; Notable for the following:    Squamous Epithelial / LPF 6-30  (*)    Bacteria, UA FEW (*)    All other components within normal limits  LIPASE, BLOOD  COMPREHENSIVE METABOLIC PANEL  I-STAT BETA HCG BLOOD, ED ([facility], WL, AP ONLY)    Imaging Review No results found. I have personally reviewed and evaluated these images and lab results as part of my medical decision-making.   EKG Interpretation None      MDM   Final diagnoses:  Non-intractable vomiting with nausea, vomiting of unspecified type    40 year old female presents with vomiting since Monday. Denies hematemesis, fever, chills, abdominal pain, diarrhea, constipation, dysuria, urgency, frequency.  Patient is afebrile. Vital signs stable. Heart regular rate and rhythm. Lungs clear to auscultation bilaterally. Abdomen soft, nontender, nondistended. No CVA tenderness.  Will give fluids and phenergan. Labs pending.   CBC negative for leukocytosis or anemia. CMP unremarkable. Lipase within normal limits. Beta hCG negative. UA remarkable for large hemoglobin, though patient states she is currently on her menstrual cycle.  On reassessment of patient, she reports symptom improvement and is able to tolerate PO intake. Patient is nontoxic and well-appearing, feel she is stable for discharge at this time. Will give phenergan for nausea for home, as patient has an allergy to zofran. Patient to follow-up with PCP. Strict return precautions discussed. Patient verbalizes her understanding and is in agreement with plan.  BP 121/71 mmHg  Pulse 70  Temp(Src) 98.1 F (36.7 C) (Oral)  Resp 18  Ht 5\' 8"  (1.727 m)  Wt 89.359 kg  BMI 29.96 kg/m2  SpO2 100%  LMP 10/30/2015 (Exact Date)    Marella Chimes, PA-C 10/30/15 1156  Wandra Arthurs, MD 10/30/15 [phone]

## 2015-10-30 NOTE — Discharge Instructions (Signed)
1. Medications: phenergan for nausea, usual home medications 2. Treatment: rest, drink plenty of fluids  3. Follow Up: please followup with your primary doctor for discussion of your diagnoses and further evaluation after today's visit; if you do not have a primary care doctor use the resource guide provided to find one; please return to the ER for high fever, persistent vomiting, new or worsening symptoms    Nausea and Vomiting Nausea means you feel sick to your stomach. Throwing up (vomiting) is a reflex where stomach contents come out of your mouth. HOME CARE   Take medicine as told by your doctor.  Do not force yourself to eat. However, you do need to drink fluids.  If you feel like eating, eat a normal diet as told by your doctor.  Eat rice, wheat, potatoes, bread, lean meats, yogurt, fruits, and vegetables.  Avoid high-fat foods.  Drink enough fluids to keep your pee (urine) clear or pale yellow.  Ask your doctor how to replace body fluid losses (rehydrate). Signs of body fluid loss (dehydration) include:  Feeling very thirsty.  Dry lips and mouth.  Feeling dizzy.  Dark pee.  Peeing less than normal.  Feeling confused.  Fast breathing or heart rate. GET HELP RIGHT AWAY IF:   You have blood in your throw up.  You have black or bloody poop (stool).  You have a bad headache or stiff neck.  You feel confused.  You have bad belly (abdominal) pain.  You have chest pain or trouble breathing.  You do not pee at least once every 8 hours.  You have cold, clammy skin.  You keep throwing up after 24 to 48 hours.  You have a fever. MAKE SURE YOU:   Understand these instructions.  Will watch your condition.  Will get help right away if you are not doing well or get worse.   This information is not intended to replace advice given to you by your health care provider. Make sure you discuss any questions you have with your health care provider.   Document  Released: 02/02/2008 Document Revised: 11/08/2011 Document Reviewed: 01/15/2011 Elsevier Interactive Patient Education 2016 [employer].   Emergency Department Resource Guide 1) Find a Doctor and Pay Out of Pocket Although you won't have to find out who is covered by your insurance plan, it is a good idea to ask around and get recommendations. You will then need to call the office and see if the doctor you have chosen will accept you as a new patient and what types of options they offer for patients who are self-pay. Some doctors offer discounts or will set up payment plans for their patients who do not have insurance, but you will need to ask so you aren't surprised when you get to your appointment.  2) Contact Your Local Health Department Not all health departments have doctors that can see patients for sick visits, but many do, so it is worth a call to see if yours does. If you don't know where your local health department is, you can check in your phone book. The CDC also has a tool to help you locate your state's health department, and many state websites also have listings of all of their local health departments.  3) Find a [facility] If your illness is not likely to be very severe or complicated, you may want to try a walk in clinic. These are popping up all over the country in pharmacies, drugstores, and shopping centers.  They're usually staffed by nurse practitioners or physician assistants that have been trained to treat common illnesses and complaints. They're usually fairly quick and inexpensive. However, if you have serious medical issues or chronic medical problems, these are probably not your best option.  No Primary Care Doctor: - Call Health Connect at  [phone] - they can help you locate a primary care doctor that  accepts your insurance, provides certain services, etc. - Physician Referral Service- [phone]  Chronic Pain Problems: Organization          Address  Phone   Notes  [facility]  [phone] Patients need to be referred by their primary care doctor.   Medication Assistance: Organization         Address  Phone   Notes  [facility] Medication [facility] [address] [place], [place] [address] [phone] --Must be a resident of [facility] -- Must have NO insurance coverage whatsoever (no Medicaid/ Medicare, etc.) -- The pt. MUST have a primary care doctor that directs their care regularly and follows them in the community   MedAssist  [phone]   [employer]  [phone]    Agencies that provide inexpensive medical care: Organization         Address  Phone   Notes  [place]  [phone]   [facility]    [phone]   [facility] [place], [place] [address] [phone]   [place] [address] [place] [phone]   Planned Parenthood    [phone]   [facility]    [phone]   [place] and [address] [place], [place] Phone:  [phone], Fax:  [phone] Hours of Operation:  9 am - 6 pm, M-F.  Also accepts Medicaid/Medicare and self-pay.  [facility] for [place], [address], [place] Phone: [phone], Fax: [phone]. Hours of Operation:  8:30 am - 5:30 pm, M-F.  Also accepts Medicaid and self-pay.  [facility] [place] [address], [place] Phone: [phone]   [place], [place], [place] [phone], Ext. [phone] Mondays & Thursdays: 7-9 AM.  First 15 patients are seen on a first come, first serve basis.    [place] Providers:  Organization         Address  Phone   Notes  [facility] [address], [place] [phone] Also accepts self-pay patients.  [facility] [id number] [place], [place]  [phone]   [place], [address], [place] [phone]   [facility] [address], [place] [phone]   Lucianne Lei [address], [place]   [phone] Only accepts [place] Access [place] patients after they have their name applied to their card.   Self-Pay (no insurance) in [facility]:  Organization         Address  Phone   Notes  Sickle Cell Patients, [facility] Internal Medicine [place] [phone]   [facility] [place] [phone]   [facility]gent [place]  [place] [address] [place], [place] [phone]  [facility]/Dr. Osei-Bonsu  [address] [place] or [address], [place] [phone] Phone number for both [place] and [place] locations is the same.  Urgent Medical and [facility] [address], [place] [phone]   [facility] [address], [place] or [address] [phone]   [facility] [address], [place] [phone], phone; [phone], fax Sees patients 1st and 3rd Saturday of every month.  Must not qualify for public or private insurance (i.e. Medicaid, Medicare, [place] Health Choice, Veterans' Benefits)  Household income should be no more than 200% of the poverty level The clinic cannot treat you if you are pregnant or think you are pregnant  Sexually transmitted diseases are not treated at the clinic.    Dental Care: Organization         Address  Phone  Notes  [facility] Department of [facility] [place] [phone] Accepts children up to age 35 who are enrolled in [place] or [place]; pregnant women with a Medicaid card; and  children who have applied for Medicaid or [place] Health Choice, but were declined, whose parents can pay a reduced fee at time of service.  [facility] Department of [facility]  [address], [place] [phone] Accepts children up to age 54 who are enrolled in [place] or [place]; pregnant women with a Medicaid card; and children who have applied for Medicaid or [place] Health Choice, but were declined, whose parents can pay a reduced fee at time of service.  [place] Adult Dental Access PROGRAM  [place] [phone] Patients are seen by appointment only. Walk-ins are not accepted. [place] will see patients 55 years of age and older. Monday - Tuesday (8am-5pm) Most Wednesdays (8:30-5pm) $30 per visit, cash only  [facility] Adult Dental Access PROGRAM  [address], [facility] [phone] Patients are seen by appointment only. Walk-ins are not accepted. [place] will see patients 78 years of age and older. One Wednesday Evening (Monthly: Volunteer Based).  $30 per visit, cash only  [place]  [phone] for adults; Children under age 36, call Graduate Pediatric Dentistry at [phone]. Children aged 4-14, please call [phone] to request a pediatric application.  Dental services are provided in all areas of dental care including fillings, crowns and bridges, complete and partial dentures, implants, gum treatment, root canals, and extractions. Preventive care is also provided. Treatment is provided to both adults and children. Patients are selected via a lottery and there is often a waiting list.   [facility] [place] [facility] [address] [place]  [phone] [email or web address]   [facility] [address] [place], [place] [phone], Ext. [phone] Second and Fourth Thursday of each month, opens at 6:30 AM; Clinic ends at 9 AM.  Patients are seen on a first-come first-served  basis, and a limited number are seen during each clinic.   [facility]  [address] Hillard Danker [place], [place] [phone]   Eligibility Requirements You must have lived in [place], [place], or [place] counties for at least the last three months.   You cannot be eligible for state or federal sponsored [employer], including [employer], [place], or [employer].   You generally cannot be eligible for healthcare insurance  through your employer.    How to apply: Eligibility screenings are held every Tuesday and Wednesday afternoon from 1:00 pm until 4:00 pm. You do not need an appointment for the interview!  [facility] [address], [place], [place]   [place]  [place] Department  [place]  [phone]    Behavioral Health Resources in the Community: Intensive Outpatient Programs Organization         Address  Phone  Notes  [place]. [address], [place], [place] [phone]   [facility]patient [address] [place], [place]   ADS: Alcohol & Drug Svcs [address], [place], [place]   [place] [address],  [place], [place] or [phone]   Substance Abuse Resources Organization         Address  Phone  Notes  Alcohol and Drug Services  [phone]   [place]  [phone]   [facility] [place]   Chinita Pester  [phone]   Residential & Outpatient Substance Abuse Program  [phone]   Psychological Services Organization         Address  Phone  Notes  [facility] [place]  [place]  [phone]   [place] [address] [place] or [phone]    Mobile Crisis Teams Organization          Address  Phone  Notes  [facility], Mobile Crisis Care Unit  [phone]   Assertive Psychotherapeutic Services  [address] [place], [place]   [place] [address] [place] [phone]    Self-Help/Support Groups Organization         Address  Phone             Notes  [place]. of [place] - variety of support groups  [place] Call for more information  Narcotics Anonymous (NA), [facility] [address], [employer] [place]  2 meetings at this location   Special educational needs teacher         Address  Phone  Notes  AS[facility] Residential Treatment [place],    [place]  [phone]   [facility]  [address] [place] [id number], [place], [place]   [place], [place] [phone] Admissions: 8am-3pm M-F  Incentives Substance [place] [address],    [place], [place] [phone]   [facility] [address] [place], [place], [place]   [employer] [facility] [address]  [place], [place]   [facility]ight Programs - Intensive Outpatient [address], [place], [place]   [facility] ([place].,  [place], [place] [phone] or [phone]   [facility] Services (RTS) [address] [place], [place] Accepts Medicaid  [facility] [place] [address],  [place] [phone] Substance Abuse/Addiction Treatment   [facility] Organization         Address  Phone  Notes  [facility]  [phone]   Domenic Schwab, PhD [address] [place], [place]   [phone] or [phone]   [place] Dortches [place], [place] [phone]  [facility] [address], [place], [place] [phone] Insurance/Medicaid/sponsorship  through [employer] and Families [address] [place], [place] [phone] [place], [place] [phone]    Dr. Adele Schilder  [phone]   [facility] Dept. 1) [address], [place] 2) [place] 3)  [place] [address], [place] [phone]  [phone]   [place] [phone] or [phone] (After Hours)

## 2015-10-30 NOTE — ED Notes (Signed)
Pt ambulated to the bathroom with ease 

## 2016-01-11 ENCOUNTER — Emergency Department (HOSPITAL_COMMUNITY)
Admission: EM | Admit: 2016-01-11 | Discharge: 2016-01-11 | Disposition: A | Discharge status: Home / Self Care | Payer: Self-pay | Attending: Emergency Medicine | Admitting: Emergency Medicine

## 2016-01-11 ENCOUNTER — Encounter (HOSPITAL_COMMUNITY): Payer: Self-pay | Admitting: *Deleted

## 2016-01-11 DIAGNOSIS — N390 Urinary tract infection, site not specified: Secondary | ICD-10-CM | POA: Insufficient documentation

## 2016-01-11 DIAGNOSIS — J45909 Unspecified asthma, uncomplicated: Secondary | ICD-10-CM | POA: Insufficient documentation

## 2016-01-11 DIAGNOSIS — J020 Streptococcal pharyngitis: Secondary | ICD-10-CM | POA: Insufficient documentation

## 2016-01-11 DIAGNOSIS — Z3202 Encounter for pregnancy test, result negative: Secondary | ICD-10-CM | POA: Insufficient documentation

## 2016-01-11 DIAGNOSIS — Z79899 Other long term (current) drug therapy: Secondary | ICD-10-CM | POA: Insufficient documentation

## 2016-01-11 LAB — URINALYSIS, ROUTINE W REFLEX MICROSCOPIC
Bilirubin Urine: NEGATIVE
Glucose, UA: NEGATIVE mg/dL
Hgb urine dipstick: NEGATIVE
Ketones, ur: NEGATIVE mg/dL
Nitrite: NEGATIVE
Protein, ur: NEGATIVE mg/dL
Specific Gravity, Urine: 1.023 (ref 1.005–1.030)
pH: 7 (ref 5.0–8.0)

## 2016-01-11 LAB — BASIC METABOLIC PANEL
Anion gap: 11 (ref 5–15)
BUN: 11 mg/dL (ref 6–20)
CO2: 21 mmol/L — ABNORMAL LOW (ref 22–32)
Calcium: 9.5 mg/dL (ref 8.9–10.3)
Chloride: 108 mmol/L (ref 101–111)
Creatinine, Ser: 0.96 mg/dL (ref 0.44–1.00)
GFR calc Af Amer: >60 mL/min (ref >60)
GFR calc non Af Amer: >60 mL/min (ref >60)
Glucose, Bld: 87 mg/dL (ref 65–99)
Potassium: 4.1 mmol/L (ref 3.5–5.1)
Sodium: 140 mmol/L (ref 135–145)

## 2016-01-11 LAB — RAPID STREP SCREEN (MED CTR MEBANE ONLY): Streptococcus, Group A Screen (Direct): POSITIVE — AB

## 2016-01-11 LAB — URINE MICROSCOPIC-ADD ON: RBC / HPF: NONE SEEN RBC/hpf (ref 0–5)

## 2016-01-11 LAB — CBC WITH DIFFERENTIAL/PLATELET
Basophils Absolute: 0 10*3/uL (ref 0.0–0.1)
Basophils Relative: 0 %
Eosinophils Absolute: 0.2 10*3/uL (ref 0.0–0.7)
Eosinophils Relative: 2 %
HCT: 43.6 % (ref 36.0–46.0)
Hemoglobin: 14.4 g/dL (ref 12.0–15.0)
Lymphocytes Relative: 20 %
Lymphs Abs: 2 10*3/uL (ref 0.7–4.0)
MCH: 33.3 pg (ref 26.0–34.0)
MCHC: 33 g/dL (ref 30.0–36.0)
MCV: 100.9 fL — ABNORMAL HIGH (ref 78.0–100.0)
Monocytes Absolute: 0.5 10*3/uL (ref 0.1–1.0)
Monocytes Relative: 5 %
Neutro Abs: 7.2 10*3/uL (ref 1.7–7.7)
Neutrophils Relative %: 73 %
Platelets: 238 10*3/uL (ref 150–400)
RBC: 4.32 MIL/uL (ref 3.87–5.11)
RDW: 12.3 % (ref 11.5–15.5)
WBC: 9.9 10*3/uL (ref 4.0–10.5)

## 2016-01-11 LAB — I-STAT BETA HCG BLOOD, ED (MC, WL, AP ONLY): I-stat hCG, quantitative: <5 m[IU]/mL (ref <5)

## 2016-01-11 MED ORDER — CEPHALEXIN 500 MG PO CAPS: 500.0000 mg | ORAL_CAPSULE | Freq: Two times a day (BID) | ORAL | Status: DC

## 2016-01-11 NOTE — ED Notes (Signed)
Pt states sore throat, pain with swallowing and fevers of 101.  Also states burning with urination.

## 2016-01-11 NOTE — Discharge Instructions (Signed)
Please read and follow all provided instructions.  Your diagnoses today include:  1. Strep pharyngitis   2. UTI (lower urinary tract infection)    Tests performed today include:  Urine test - suggests that you have an infection in your bladder  Vital signs. See below for your results today.   Medications prescribed:   Take as prescribed   Home care instructions:  Follow any educational materials contained in this packet.  Follow-up instructions: Please follow-up with your primary care provider in 3 days if symptoms are not resolved for further evaluation of your symptoms.  Return instructions:   Please return to the Emergency Department if you experience worsening symptoms.   Return with fever, worsening pain, persistent vomiting, worsening pain in your back.   Please return if you have any other emergent concerns.  Additional Information:  Your vital signs today were: BP 113/75 mmHg   Pulse 81   Temp(Src) 99.4 F (37.4 C) (Oral)   Resp 18   SpO2 99%   LMP 12/29/2015 If your blood pressure (BP) was elevated above 135/85 this visit, please have this repeated by your doctor within one month. --------------

## 2016-01-11 NOTE — ED Provider Notes (Signed)
CSN: [id number]     Arrival date & time 01/11/16  1056 History   First MD Initiated Contact with Patient 01/11/16 1432     Chief Complaint  Patient presents with  . Sore Throat  . Urinary Tract Infection   (Consider location/radiation/quality/duration/timing/severity/associated sxs/prior Treatment) HPI  40 y.o. female presents to the Emergency Department today complaining of sore throat and pain with swallowing since Wednesday. Pt notes fevers at home of TMax 101F which she treated with Motrin. Pt is able to tolerate PO without difficulty. No pain with ROM of neck. No N/V/D. No rhinorrhea. No CP/SOB/ABD pain. No headaches. Notes pain in throat is 10/10 and sore. OTC remedies without relief. Pt also endorses burning with urination. No hematuria. No vaginal bleeding/discharge. No pelvic pain. No other symptoms noted.   Past Medical History  Diagnosis Date  . Asthma    Past Surgical History  Procedure Laterality Date  . Hernia repair     No family history on file. Social History  Substance Use Topics  . Smoking status: Never Smoker   . Smokeless tobacco: Never Used  . Alcohol Use: No   OB History    No data available     Review of Systems ROS reviewed and all are negative for acute change except as noted in the HPI.  Allergies  Bactrim; Flagyl; Morphine and related; Prednisone; and Zofran  Home Medications   Prior to Admission medications   Medication Sig Start Date End Date Taking? Authorizing Provider  albuterol (PROVENTIL HFA;VENTOLIN HFA) 108 (90 BASE) MCG/ACT inhaler Inhale 1 puff into the lungs every 6 (six) hours as needed for wheezing or shortness of breath.   Yes Historical Provider, MD  promethazine (PHENERGAN) 25 MG tablet Take 1 tablet (25 mg total) by mouth every 6 (six) hours as needed for nausea or vomiting. 10/30/15  Yes Elizabeth C Westfall, PA-C   BP 113/75 mmHg  Pulse 81  Temp(Src) 99.4 F (37.4 C) (Oral)  Resp 18  SpO2 99%  LMP 12/29/2015   Physical  Exam  Constitutional: She is oriented to person, place, and time. She appears well-developed and well-nourished. No distress.  HENT:  Head: Normocephalic and atraumatic.  Right Ear: Tympanic membrane, external ear and ear canal normal.  Left Ear: Tympanic membrane, external ear and ear canal normal.  Nose: Nose normal.  Mouth/Throat: Uvula is midline and mucous membranes are normal. No trismus in the jaw. Normal dentition. No dental abscesses, uvula swelling or dental caries. Oropharyngeal exudate and posterior oropharyngeal erythema present. No tonsillar abscesses.  Eyes: EOM are normal. Pupils are equal, round, and reactive to light.  Neck: Normal range of motion. Neck supple. No tracheal deviation present.  Cardiovascular: Normal rate, regular rhythm, S1 normal, S2 normal, normal heart sounds, intact distal pulses and normal pulses.   Pulmonary/Chest: Effort normal and breath sounds normal. No respiratory distress. She has no decreased breath sounds. She has no wheezes. She has no rhonchi. She has no rales.  Abdominal: Soft. Normal appearance and bowel sounds are normal. There is no tenderness. There is no rigidity, no rebound, no guarding, no tenderness at McBurney's point and negative Murphy's sign.  Musculoskeletal: Normal range of motion.  Neurological: She is alert and oriented to person, place, and time.  Skin: Skin is warm and dry.  Psychiatric: She has a normal mood and affect. Her speech is normal and behavior is normal. Thought content normal.  Nursing note and vitals reviewed.  ED Course  Procedures (including critical care  time) Labs Review Labs Reviewed  RAPID STREP SCREEN (NOT AT [facility]) - Abnormal; Notable for the following:    Streptococcus, Group A Screen (Direct) POSITIVE (*)    All other components within normal limits  CBC WITH DIFFERENTIAL/PLATELET - Abnormal; Notable for the following:    MCV 100.9 (*)    All other components within normal limits  BASIC METABOLIC  PANEL - Abnormal; Notable for the following:    CO2 21 (*)    All other components within normal limits  URINALYSIS, ROUTINE W REFLEX MICROSCOPIC (NOT AT [facility]) - Abnormal; Notable for the following:    APPearance CLOUDY (*)    Leukocytes, UA SMALL (*)    All other components within normal limits  URINE MICROSCOPIC-ADD ON - Abnormal; Notable for the following:    Squamous Epithelial / LPF TOO NUMEROUS TO COUNT (*)    Bacteria, UA MANY (*)    All other components within normal limits  I-STAT BETA HCG BLOOD, ED ([facility], [facility], [facility] ONLY)   Imaging Review No results found. I have personally reviewed and evaluated these images and lab results as part of my medical decision-making.   EKG Interpretation None      MDM  I have reviewed and evaluated the relevant laboratory values. I have reviewed the relevant previous healthcare records.I obtained HPI from historian. Patient discussed with supervising physician  ED Course:  Assessment: Pt is a 39yF who presents with Sore Throat and Dysuria. On exam, pt in NAD. Nontoxic/nonseptic appearing. VSS. Mild temp. Lungs CTA. Heart RRR. Abdomen nontender soft. No pain. Posterior oropharynx erythematous with exudate. No trismus. Able to Full ROM of neck. Pt able to tolerate PO. Strep Positive. Labs otherwise unremarkable. UA with UTI. Plan is to DC home with ABX of Keflex to cover both. At time of discharge, Patient is in no acute distress. Vital Signs are stable. Patient is able to ambulate. Patient able to tolerate PO.    Disposition/Plan:  DC Home Additional Verbal discharge instructions given and discussed with patient.  Pt Instructed to f/u with PCP in the next week for evaluation and treatment of symptoms. Return precautions given Pt acknowledges and agrees with plan  Supervising Physician Virgel Manifold, MD   Final diagnoses:  Strep pharyngitis  UTI (lower urinary tract infection)       Shary Decamp, PA-C 01/11/16 [place],  MD 01/11/16 [phone]

## 2016-03-10 ENCOUNTER — Encounter (HOSPITAL_COMMUNITY): Payer: Self-pay | Admitting: *Deleted

## 2016-03-10 DIAGNOSIS — Z5321 Procedure and treatment not carried out due to patient leaving prior to being seen by health care provider: Secondary | ICD-10-CM | POA: Insufficient documentation

## 2016-03-10 DIAGNOSIS — R109 Unspecified abdominal pain: Secondary | ICD-10-CM | POA: Insufficient documentation

## 2016-03-10 DIAGNOSIS — J45909 Unspecified asthma, uncomplicated: Secondary | ICD-10-CM | POA: Insufficient documentation

## 2016-03-10 NOTE — ED Notes (Signed)
The pt is c/o abd pain and nausea since this am  lmp last  Month 3rd week

## 2016-03-10 NOTE — ED Notes (Signed)
The pt has had some painful urination no bloody urine seen

## 2016-03-11 ENCOUNTER — Emergency Department (HOSPITAL_COMMUNITY)
Admission: EM | Admit: 2016-03-11 | Discharge: 2016-03-11 | Disposition: A | Discharge status: Home / Self Care | Payer: Self-pay | Attending: Emergency Medicine | Admitting: Emergency Medicine

## 2016-03-11 ENCOUNTER — Encounter (HOSPITAL_COMMUNITY): Payer: Self-pay | Admitting: Neurology

## 2016-03-11 DIAGNOSIS — J45909 Unspecified asthma, uncomplicated: Secondary | ICD-10-CM | POA: Insufficient documentation

## 2016-03-11 DIAGNOSIS — N760 Acute vaginitis: Secondary | ICD-10-CM | POA: Insufficient documentation

## 2016-03-11 DIAGNOSIS — B373 Candidiasis of vulva and vagina: Secondary | ICD-10-CM

## 2016-03-11 DIAGNOSIS — N390 Urinary tract infection, site not specified: Secondary | ICD-10-CM | POA: Insufficient documentation

## 2016-03-11 DIAGNOSIS — Z79899 Other long term (current) drug therapy: Secondary | ICD-10-CM | POA: Insufficient documentation

## 2016-03-11 DIAGNOSIS — B3731 Acute candidiasis of vulva and vagina: Secondary | ICD-10-CM

## 2016-03-11 LAB — COMPREHENSIVE METABOLIC PANEL
ALBUMIN: 3.7 g/dL (ref 3.5–5.0)
ALK PHOS: 55 U/L (ref 38–126)
ALT: 14 U/L (ref 14–54)
ANION GAP: 5 (ref 5–15)
AST: 27 U/L (ref 15–41)
BUN: 15 mg/dL (ref 6–20)
CO2: 23 mmol/L (ref 22–32)
Calcium: 9.2 mg/dL (ref 8.9–10.3)
Chloride: 108 mmol/L (ref 101–111)
Creatinine, Ser: 0.95 mg/dL (ref 0.44–1.00)
GFR calc Af Amer: >60 mL/min (ref >60)
GFR calc non Af Amer: >60 mL/min (ref >60)
GLUCOSE: 94 mg/dL (ref 65–99)
POTASSIUM: 4.5 mmol/L (ref 3.5–5.1)
SODIUM: 136 mmol/L (ref 135–145)
Total Bilirubin: 1.1 mg/dL (ref 0.3–1.2)
Total Protein: 6.6 g/dL (ref 6.5–8.1)

## 2016-03-11 LAB — CBC
HEMATOCRIT: 39.3 % (ref 36.0–46.0)
HEMOGLOBIN: 13 g/dL (ref 12.0–15.0)
MCH: 33.4 pg (ref 26.0–34.0)
MCHC: 33.1 g/dL (ref 30.0–36.0)
MCV: 101 fL — ABNORMAL HIGH (ref 78.0–100.0)
Platelets: 241 10*3/uL (ref 150–400)
RBC: 3.89 MIL/uL (ref 3.87–5.11)
RDW: 12.7 % (ref 11.5–15.5)
WBC: 6.9 10*3/uL (ref 4.0–10.5)

## 2016-03-11 LAB — URINALYSIS, ROUTINE W REFLEX MICROSCOPIC
BILIRUBIN URINE: NEGATIVE
GLUCOSE, UA: NEGATIVE mg/dL
Ketones, ur: NEGATIVE mg/dL
Nitrite: NEGATIVE
Protein, ur: NEGATIVE mg/dL
SPECIFIC GRAVITY, URINE: 1.031 — AB (ref 1.005–1.030)
pH: 6 (ref 5.0–8.0)

## 2016-03-11 LAB — URINE MICROSCOPIC-ADD ON

## 2016-03-11 LAB — POC URINE PREG, ED: Preg Test, Ur: NEGATIVE

## 2016-03-11 LAB — LIPASE, BLOOD: Lipase: 36 U/L (ref 11–51)

## 2016-03-11 MED ORDER — NITROFURANTOIN MONOHYD MACRO 100 MG PO CAPS
100.0000 mg | ORAL_CAPSULE | Freq: Once | ORAL | Status: AC
  Administered 2016-03-11: 100 mg via ORAL
  Filled 2016-03-11: qty 1

## 2016-03-11 MED ORDER — PHENAZOPYRIDINE HCL 100 MG PO TABS
100.0000 mg | ORAL_TABLET | Freq: Once | ORAL | Status: AC
  Administered 2016-03-11: 100 mg via ORAL
  Filled 2016-03-11: qty 1

## 2016-03-11 MED ORDER — FLUCONAZOLE 150 MG PO TABS: 150.0000 mg | ORAL_TABLET | Freq: Every day | ORAL | Status: DC

## 2016-03-11 MED ORDER — PHENAZOPYRIDINE HCL 200 MG PO TABS: 200.0000 mg | ORAL_TABLET | Freq: Three times a day (TID) | ORAL | Status: DC

## 2016-03-11 MED ORDER — FLUCONAZOLE 100 MG PO TABS
150.0000 mg | ORAL_TABLET | Freq: Once | ORAL | Status: AC
  Administered 2016-03-11: 150 mg via ORAL
  Filled 2016-03-11: qty 2

## 2016-03-11 MED ORDER — NITROFURANTOIN MONOHYD MACRO 100 MG PO CAPS: 100.0000 mg | ORAL_CAPSULE | Freq: Two times a day (BID) | ORAL | Status: DC

## 2016-03-11 NOTE — ED Notes (Signed)
Pt stated that she didn't want to wait any longer and wanted to go home

## 2016-03-11 NOTE — ED Notes (Signed)
Pt here c/o RLQ abd pain, and burning when she urinates, and frequency. Denies hematuria, but reports vaginal discharge after taking Keflex for UTI last month. Pt was here last night but didn't stay.

## 2016-03-11 NOTE — Discharge Instructions (Signed)
Urinary Tract Infection Urinary tract infections (UTIs) can develop anywhere along your urinary tract. Your urinary tract is your body's drainage system for removing wastes and extra water. Your urinary tract includes two kidneys, two ureters, a bladder, and a urethra. Your kidneys are a pair of bean-shaped organs. Each kidney is about the size of your fist. They are located below your ribs, one on each side of your spine. CAUSES Infections are caused by microbes, which are microscopic organisms, including fungi, viruses, and bacteria. These organisms are so small that they can only be seen through a microscope. Bacteria are the microbes that most commonly cause UTIs. SYMPTOMS  Symptoms of UTIs may vary by age and gender of the patient and by the location of the infection. Symptoms in young women typically include a frequent and intense urge to urinate and a painful, burning feeling in the bladder or urethra during urination. Older women and men are more likely to be tired, shaky, and weak and have muscle aches and abdominal pain. A fever may mean the infection is in your kidneys. Other symptoms of a kidney infection include pain in your back or sides below the ribs, nausea, and vomiting. DIAGNOSIS To diagnose a UTI, your caregiver will ask you about your symptoms. Your caregiver will also ask you to provide a urine sample. The urine sample will be tested for bacteria and white blood cells. White blood cells are made by your body to help fight infection. TREATMENT  Typically, UTIs can be treated with medication. Because most UTIs are caused by a bacterial infection, they usually can be treated with the use of antibiotics. The choice of antibiotic and length of treatment depend on your symptoms and the type of bacteria causing your infection. HOME CARE INSTRUCTIONS  If you were prescribed antibiotics, take them exactly as your caregiver instructs you. Finish the medication even if you feel better after  you have only taken some of the medication.  Drink enough water and fluids to keep your urine clear or pale yellow.  Avoid caffeine, tea, and carbonated beverages. They tend to irritate your bladder.  Empty your bladder often. Avoid holding urine for long periods of time.  Empty your bladder before and after sexual intercourse.  After a bowel movement, women should cleanse from front to back. Use each tissue only once. SEEK MEDICAL CARE IF:   You have back pain.  You develop a fever.  Your symptoms do not begin to resolve within 3 days. SEEK IMMEDIATE MEDICAL CARE IF:   You have severe back pain or lower abdominal pain.  You develop chills.  You have nausea or vomiting.  You have continued burning or discomfort with urination. MAKE SURE YOU:   Understand these instructions.  Will watch your condition.  Will get help right away if you are not doing well or get worse.   This information is not intended to replace advice given to you by your health care provider. Make sure you discuss any questions you have with your health care provider.   Document Released: 05/26/2005 Document Revised: 05/07/2015 Document Reviewed: 09/24/2011 Elsevier Interactive Patient Education 2016 Else[employer]  Monilial Vaginitis Vaginitis in a soreness, swelling and redness (inflammation) of the vagina and vulva. Monilial vaginitis is not a sexually transmitted infection. CAUSES  Yeast vaginitis is caused by yeast (candida) that is normally found in your vagina. With a yeast infection, the candida has overgrown in number to a point that upsets the chemical balance. SYMPTOMS  White, thick vaginal discharge.  Swelling, itching, redness and irritation of the vagina and possibly the lips of the vagina (vulva).  Burning or painful urination.  Painful intercourse. DIAGNOSIS  Things that may contribute to monilial vaginitis are:  Postmenopausal and virginal  states.  Pregnancy.  Infections.  Being tired, sick or stressed, especially if you had monilial vaginitis in the past.  Diabetes. Good control will help lower the chance.  Birth control pills.  Tight fitting garments.  Using bubble bath, feminine sprays, douches or deodorant tampons.  Taking certain medications that kill germs (antibiotics).  Sporadic recurrence can occur if you become ill. TREATMENT  Your caregiver will give you medication.  There are several kinds of anti monilial vaginal creams and suppositories specific for monilial vaginitis. For recurrent yeast infections, use a suppository or cream in the vagina 2 times a week, or as directed.  Anti-monilial or steroid cream for the itching or irritation of the vulva may also be used. Get your caregiver's permission.  Painting the vagina with methylene blue solution may help if the monilial cream does not work.  Eating yogurt may help prevent monilial vaginitis. HOME CARE INSTRUCTIONS   Finish all medication as prescribed.  Do not have sex until treatment is completed or after your caregiver tells you it is okay.  Take warm sitz baths.  Do not douche.  Do not use tampons, especially scented ones.  Wear cotton underwear.  Avoid tight pants and panty hose.  Tell your sexual partner that you have a yeast infection. They should go to their caregiver if they have symptoms such as mild rash or itching.  Your sexual partner should be treated as well if your infection is difficult to eliminate.  Practice safer sex. Use condoms.  Some vaginal medications cause latex condoms to fail. Vaginal medications that harm condoms are:  Cleocin cream.  Butoconazole (Femstat).  Terconazole (Terazol) vaginal suppository.  Miconazole (Monistat) (may be purchased over the counter). SEEK MEDICAL CARE IF:   You have a temperature by mouth above 102 F (38.9 C).  The infection is getting worse after 2 days of  treatment.  The infection is not getting better after 3 days of treatment.  You develop blisters in or around your vagina.  You develop vaginal bleeding, and it is not your menstrual period.  You have pain when you urinate.  You develop intestinal problems.  You have pain with sexual intercourse.   This information is not intended to replace advice given to you by your health care provider. Make sure you discuss any questions you have with your health care provider.   Document Released: 05/26/2005 Document Revised: 11/08/2011 Document Reviewed: 02/17/2015 Elsevier Interactive Patient Education [employer].

## 2016-03-14 NOTE — ED Provider Notes (Signed)
CSN: [id number]     Arrival date & time 03/11/16  0801 History   First MD Initiated Contact with Patient 03/11/16 [phone]     Chief Complaint  Patient presents with  . Abdominal Pain  . Urinary Frequency     HPI  Patient presents with a complaint of burning with urination and urinary frequency. No blood in her urine because of vaginal discharge. Took Keflex for urinary tract infection last month. Her last night but didn't stay because of the long wait.  Past Medical History  Diagnosis Date  . Asthma    Past Surgical History  Procedure Laterality Date  . Hernia repair    . Cholecystectomy     No family history on file. Social History  Substance Use Topics  . Smoking status: Never Smoker   . Smokeless tobacco: Never Used  . Alcohol Use: No   OB History    No data available     Review of Systems  Constitutional: Negative for fever, chills, diaphoresis, appetite change and fatigue.  HENT: Negative for mouth sores, sore throat and trouble swallowing.   Eyes: Negative for visual disturbance.  Respiratory: Negative for cough, chest tightness, shortness of breath and wheezing.   Cardiovascular: Negative for chest pain.  Gastrointestinal: Negative for nausea, vomiting, abdominal pain, diarrhea and abdominal distention.  Endocrine: Negative for polydipsia, polyphagia and polyuria.  Genitourinary: Positive for frequency and vaginal discharge. Negative for dysuria and hematuria.  Musculoskeletal: Negative for gait problem.  Skin: Negative for color change, pallor and rash.  Neurological: Negative for dizziness, syncope, light-headedness and headaches.  Hematological: Does not bruise/bleed easily.  Psychiatric/Behavioral: Negative for behavioral problems and confusion.      Allergies  Bactrim; Flagyl; Morphine and related; Prednisone; and Zofran  Home Medications   Prior to Admission medications   Medication Sig Start Date End Date Taking? Authorizing Provider  albuterol  (PROVENTIL HFA;VENTOLIN HFA) 108 (90 BASE) MCG/ACT inhaler Inhale 1 puff into the lungs every 6 (six) hours as needed for wheezing or shortness of breath.   Yes Historical Provider, MD  cephALEXin (KEFLEX) 500 MG capsule Take 1 capsule (500 mg total) by mouth 2 (two) times daily. 01/11/16   Shary Decamp, PA-C  fluconazole (DIFLUCAN) 150 MG tablet Take 1 tablet (150 mg total) by mouth daily. 03/11/16   Tanna Furry, MD  nitrofurantoin, macrocrystal-monohydrate, (MACROBID) 100 MG capsule Take 1 capsule (100 mg total) by mouth 2 (two) times daily. 03/11/16   Tanna Furry, MD  phenazopyridine (PYRIDIUM) 200 MG tablet Take 1 tablet (200 mg total) by mouth 3 (three) times daily. 03/11/16   Tanna Furry, MD   BP 108/96 mmHg  Pulse 62  Temp(Src) 98.1 F (36.7 C) (Oral)  Resp 14  Ht 5\' 8"  (1.727 m)  Wt 215 lb (97.523 kg)  BMI 32.70 kg/m2  SpO2 100%  LMP 02/18/2016 Physical Exam  Constitutional: She is oriented to person, place, and time. She appears well-developed and well-nourished. No distress.  HENT:  Head: Normocephalic.  Eyes: Conjunctivae are normal. Pupils are equal, round, and reactive to light. No scleral icterus.  Neck: Normal range of motion. Neck supple. No thyromegaly present.  Cardiovascular: Normal rate and regular rhythm.  Exam reveals no gallop and no friction rub.   No murmur heard. Pulmonary/Chest: Effort normal and breath sounds normal. No respiratory distress. She has no wheezes. She has no rales.  Abdominal: Soft. Bowel sounds are normal. She exhibits no distension. There is no tenderness. There is no rebound.  Musculoskeletal: Normal range of motion.  Neurological: She is alert and oriented to person, place, and time.  Skin: Skin is warm and dry. No rash noted.  Psychiatric: She has a normal mood and affect. Her behavior is normal.    ED Course  Procedures (including critical care time) Labs Review Labs Reviewed - No data to display  Imaging Review No results found. I have  personally reviewed and evaluated these images and lab results as part of my medical decision-making.   EKG Interpretation None      MDM   Final diagnoses:  UTI (lower urinary tract infection)  Yeast vaginitis    Urine shows signs of infection. Assessment contamination. Symptoms consistent with cystitis. Likely yeast vaginitis from her previous UTI and antibiotics. Plan short course antibiotic. 150 Diflucan 1 after last dose of antibiotics. Pyridium for dysuria.    Tanna Furry, MD 03/17/16 2016

## 2016-03-18 ENCOUNTER — Encounter (HOSPITAL_COMMUNITY): Payer: Self-pay | Admitting: Emergency Medicine

## 2016-03-18 ENCOUNTER — Emergency Department (HOSPITAL_COMMUNITY)
Admission: EM | Admit: 2016-03-18 | Discharge: 2016-03-18 | Disposition: A | Discharge status: Home / Self Care | Payer: Self-pay | Attending: Dermatology | Admitting: Dermatology

## 2016-03-18 ENCOUNTER — Emergency Department (HOSPITAL_COMMUNITY): Payer: Self-pay

## 2016-03-18 DIAGNOSIS — Z5321 Procedure and treatment not carried out due to patient leaving prior to being seen by health care provider: Secondary | ICD-10-CM | POA: Insufficient documentation

## 2016-03-18 DIAGNOSIS — R079 Chest pain, unspecified: Secondary | ICD-10-CM | POA: Insufficient documentation

## 2016-03-18 DIAGNOSIS — J45909 Unspecified asthma, uncomplicated: Secondary | ICD-10-CM | POA: Insufficient documentation

## 2016-03-18 LAB — CBC
HCT: 39.7 % (ref 36.0–46.0)
HEMOGLOBIN: 12.9 g/dL (ref 12.0–15.0)
MCH: 33.3 pg (ref 26.0–34.0)
MCHC: 32.5 g/dL (ref 30.0–36.0)
MCV: 102.6 fL — ABNORMAL HIGH (ref 78.0–100.0)
Platelets: 296 10*3/uL (ref 150–400)
RBC: 3.87 MIL/uL (ref 3.87–5.11)
RDW: 12.8 % (ref 11.5–15.5)
WBC: 7.2 10*3/uL (ref 4.0–10.5)

## 2016-03-18 LAB — BASIC METABOLIC PANEL
ANION GAP: 5 (ref 5–15)
BUN: 17 mg/dL (ref 6–20)
CALCIUM: 9.4 mg/dL (ref 8.9–10.3)
CO2: 25 mmol/L (ref 22–32)
CREATININE: 1.07 mg/dL — AB (ref 0.44–1.00)
Chloride: 107 mmol/L (ref 101–111)
GFR calc Af Amer: >60 mL/min (ref >60)
GFR calc non Af Amer: >60 mL/min (ref >60)
GLUCOSE: 85 mg/dL (ref 65–99)
Potassium: 4.3 mmol/L (ref 3.5–5.1)
Sodium: 137 mmol/L (ref 135–145)

## 2016-03-18 LAB — I-STAT TROPONIN, ED: TROPONIN I, POC: 0 ng/mL (ref 0.00–0.08)

## 2016-03-18 NOTE — ED Notes (Signed)
The patient is having chest pain on her left side.  The patient described the chest pain as a "poking" intermittent.  The patient said she was stung by a bee at the same time. The bee stung her in her left forearm.

## 2016-03-18 NOTE — ED Notes (Signed)
Pt at desk stating they are leaving due to wait, encouraged to stay, Pt left AMA.

## 2016-03-30 ENCOUNTER — Emergency Department (HOSPITAL_COMMUNITY)
Admission: EM | Admit: 2016-03-30 | Discharge: 2016-03-30 | Disposition: A | Discharge status: Home / Self Care | Payer: Self-pay | Attending: Physician Assistant | Admitting: Physician Assistant

## 2016-03-30 ENCOUNTER — Encounter (HOSPITAL_COMMUNITY): Payer: Self-pay | Admitting: Emergency Medicine

## 2016-03-30 DIAGNOSIS — J45909 Unspecified asthma, uncomplicated: Secondary | ICD-10-CM | POA: Insufficient documentation

## 2016-03-30 DIAGNOSIS — R238 Other skin changes: Secondary | ICD-10-CM

## 2016-03-30 NOTE — ED Notes (Signed)
Abscess right buttock.

## 2016-03-30 NOTE — ED Triage Notes (Signed)
Pt here for abscess to right buttocks x 3 days with pain and swelling

## 2016-03-30 NOTE — ED Provider Notes (Signed)
[facility] [facility] DEPT Provider Note    First Provider Contact:    First MD Initiated Contact with Patient 03/30/16 1144       By signing my name below, I, Bea Graff, attest that this documentation has been prepared under the direction and in the presence of Quincy Carnes, PA-C. Electronically Signed: Bea Graff, ED Scribe. 03/30/16. 11:51 AM.  History   Chief Complaint Chief Complaint  Patient presents with  . Abscess   The history is provided by the patient and medical records. No language interpreter was used.    HPI Comments:  Kelsey Maynard is an obese 40 y.o. female who presents to the Emergency Department complaining of an abscess to the right buttocks that appeared about three days ago.  States she feels like it a pimple that would normally be on her face.  She reports moderate to severe pain of the area. She states she has been letting hot water run over the area in the shower. Standing and touching the area increases the pain. She denies alleviating factors. She denies fever, chills, nausea, vomiting or drainage from the area.    Past Medical History:  Diagnosis Date  . Asthma     There are no active problems to display for this patient.   Past Surgical History:  Procedure Laterality Date  . CHOLECYSTECTOMY    . HERNIA REPAIR      OB History    No data available       Home Medications    Prior to Admission medications   Medication Sig Start Date End Date Taking? Authorizing Provider  albuterol (PROVENTIL HFA;VENTOLIN HFA) 108 (90 BASE) MCG/ACT inhaler Inhale 1 puff into the lungs every 6 (six) hours as needed for wheezing or shortness of breath.    Historical Provider, MD  cephALEXin (KEFLEX) 500 MG capsule Take 1 capsule (500 mg total) by mouth 2 (two) times daily. 01/11/16   Shary Decamp, PA-C  fluconazole (DIFLUCAN) 150 MG tablet Take 1 tablet (150 mg total) by mouth daily. 03/11/16   Tanna Furry, MD  nitrofurantoin, macrocrystal-monohydrate,  (MACROBID) 100 MG capsule Take 1 capsule (100 mg total) by mouth 2 (two) times daily. 03/11/16   Tanna Furry, MD  phenazopyridine (PYRIDIUM) 200 MG tablet Take 1 tablet (200 mg total) by mouth 3 (three) times daily. 03/11/16   Tanna Furry, MD    Family History History reviewed. No pertinent family history.  Social History Social History  Substance Use Topics  . Smoking status: Never Smoker  . Smokeless tobacco: Never Used  . Alcohol use No     Allergies   Bactrim [sulfamethoxazole-trimethoprim]; Flagyl [metronidazole]; Morphine and related; Prednisone; Vicodin [hydrocodone-acetaminophen]; and Zofran [ondansetron hcl]   Review of Systems Review of Systems  Constitutional: Negative for chills and fever.  Gastrointestinal: Negative for nausea and vomiting.  Skin: Positive for color change.  All other systems reviewed and are negative.    Physical Exam Updated Vital Signs BP 114/81 (BP Location: Right Arm)   Pulse 96   Temp 98.8 F (37.1 C) (Oral)   Resp 19   Ht 5\' 8"  (1.727 m)   Wt 215 lb (97.5 kg)   LMP 03/11/2016   SpO2 98%   BMI 32.69 kg/m   Physical Exam  Constitutional: She is oriented to person, place, and time. She appears well-developed and well-nourished.  HENT:  Head: Normocephalic and atraumatic.  Mouth/Throat: Oropharynx is clear and moist.  Eyes: Conjunctivae and EOM are normal. Pupils are equal, round, and reactive  to light.  Neck: Normal range of motion.  Cardiovascular: Normal rate, regular rhythm and normal heart sounds.   Pulmonary/Chest: Effort normal and breath sounds normal.  Abdominal: Soft. Bowel sounds are normal.  Genitourinary:  Genitourinary Comments: Small 0.25 cm pimple noted to central right buttock, there is no fluctuance or drainage; area is locally tender to palpation; no surrounding erythema or induration  Musculoskeletal: Normal range of motion.  Neurological: She is alert and oriented to person, place, and time.  Skin: Skin is warm  and dry.  Psychiatric: She has a normal mood and affect.  Nursing note and vitals reviewed.    ED Treatments / Results  Labs (all labs ordered are listed, but only abnormal results are displayed) Labs Reviewed - No data to display  EKG  EKG Interpretation None       Radiology No results found.  Procedures Procedures (including critical care time)  Medications Ordered in ED Medications - No data to display   Initial Impression / Assessment and Plan / ED Course  I have reviewed the triage vital signs and the nursing notes.  Pertinent labs & imaging results that were available during my care of the patient were reviewed by me and considered in my medical decision making (see chart for details).  Clinical Course  DIAGNOSTIC STUDIES: Oxygen Saturation is 98% on RA, normal by my interpretation.   COORDINATION OF CARE: 11:50 AM- Advised pt to apply warm compresses and take warm baths. Will prescribe antibiotics. Pt verbalizes understanding and agrees to plan.  Medications - No data to display  40 year old female here with very small 0.25 cm pimple noted to central right buttock. There is no fluctuance or drainage noted. No surrounding erythema or induration. She is afebrile and nontoxic in appearance. At this time, area is not amenable to I&D. Encourage warm compresses and supportive care at home.  Discussed plan with patient, he/she acknowledged understanding and agreed with plan of care.  Return precautions given for new or worsening symptoms.  I personally performed the services described in this documentation, which was scribed in my presence. The recorded information has been reviewed and is accurate.   Final Clinical Impressions(s) / ED Diagnoses   Final diagnoses:  Pimples    New Prescriptions New Prescriptions   No medications on file     Larene Pickett, PA-C 03/30/16 [place], MD 03/30/16 1319

## 2016-03-30 NOTE — Discharge Instructions (Signed)
Recommend warm compresses at home (warm washcloth, warm soaks, etc) to help facilitate healing. Keep area clean with soap and water. Return here for any new/worsening symptoms.

## 2016-04-01 ENCOUNTER — Emergency Department (HOSPITAL_COMMUNITY)
Admission: EM | Admit: 2016-04-01 | Discharge: 2016-04-01 | Disposition: A | Discharge status: Home / Self Care | Payer: Self-pay | Attending: Emergency Medicine | Admitting: Emergency Medicine

## 2016-04-01 ENCOUNTER — Emergency Department (HOSPITAL_COMMUNITY): Payer: Self-pay

## 2016-04-01 ENCOUNTER — Encounter (HOSPITAL_COMMUNITY): Payer: Self-pay | Admitting: *Deleted

## 2016-04-01 DIAGNOSIS — K529 Noninfective gastroenteritis and colitis, unspecified: Secondary | ICD-10-CM | POA: Insufficient documentation

## 2016-04-01 DIAGNOSIS — J45909 Unspecified asthma, uncomplicated: Secondary | ICD-10-CM | POA: Insufficient documentation

## 2016-04-01 LAB — CBC
HCT: 41.7 % (ref 36.0–46.0)
Hemoglobin: 13.8 g/dL (ref 12.0–15.0)
MCH: 33.7 pg (ref 26.0–34.0)
MCHC: 33.1 g/dL (ref 30.0–36.0)
MCV: 101.7 fL — AB (ref 78.0–100.0)
PLATELETS: 240 10*3/uL (ref 150–400)
RBC: 4.1 MIL/uL (ref 3.87–5.11)
RDW: 12.7 % (ref 11.5–15.5)
WBC: 5.2 10*3/uL (ref 4.0–10.5)

## 2016-04-01 LAB — BASIC METABOLIC PANEL
Anion gap: 8 (ref 5–15)
BUN: 11 mg/dL (ref 6–20)
CALCIUM: 9.3 mg/dL (ref 8.9–10.3)
CHLORIDE: 103 mmol/L (ref 101–111)
CO2: 24 mmol/L (ref 22–32)
CREATININE: 1.09 mg/dL — AB (ref 0.44–1.00)
GFR calc non Af Amer: >60 mL/min (ref >60)
Glucose, Bld: 92 mg/dL (ref 65–99)
Potassium: 4.3 mmol/L (ref 3.5–5.1)
SODIUM: 135 mmol/L (ref 135–145)

## 2016-04-01 LAB — I-STAT TROPONIN, ED: TROPONIN I, POC: 0 ng/mL (ref 0.00–0.08)

## 2016-04-01 MED ORDER — LOPERAMIDE HCL 2 MG PO CAPS
4.0000 mg | ORAL_CAPSULE | Freq: Once | ORAL | Status: AC
  Administered 2016-04-01: 4 mg via ORAL
  Filled 2016-04-01: qty 2

## 2016-04-01 MED ORDER — PROMETHAZINE HCL 25 MG PO TABS: 25.0000 mg | ORAL_TABLET | Freq: Four times a day (QID) | ORAL | 0 refills | Status: DC | PRN

## 2016-04-01 MED ORDER — LOPERAMIDE HCL 2 MG PO CAPS: 2.0000 mg | ORAL_CAPSULE | Freq: Four times a day (QID) | ORAL | 0 refills | Status: DC | PRN

## 2016-04-01 MED ORDER — PROMETHAZINE HCL 25 MG/ML IJ SOLN
25.0000 mg | Freq: Four times a day (QID) | INTRAMUSCULAR | Status: DC | PRN
  Administered 2016-04-01: 25 mg via INTRAMUSCULAR
  Filled 2016-04-01: qty 1

## 2016-04-01 MED ORDER — PROMETHAZINE HCL 25 MG/ML IJ SOLN
25.0000 mg | Freq: Once | INTRAMUSCULAR | Status: DC
  Filled 2016-04-01: qty 1

## 2016-04-01 NOTE — ED Triage Notes (Signed)
Pt c/o dry cough starting today. C/o chest pain x15-20 minutes.

## 2016-04-01 NOTE — ED Provider Notes (Signed)
[facility] DEPT Provider Note   CSN: [id number] Arrival date & time: 04/01/16  [id number]  First Provider Contact:  First MD Initiated Contact with Patient 04/01/16 2247        History   Chief Complaint Chief Complaint  Patient presents with  . Chest Pain  . Cough    HPI Kelsey Maynard is a 40 y.o. female.  HPI Patient reports that she developed vomiting and diarrhea over the past 2 days. She estimates approximately 4 episodes of vomiting today as well as 4 episodes of diarrhea. She denies abdominal pain. She for she has had some chills. She has been taking Tylenol. Also reports some cough. No sputum production or shortness of breath. Her companion reportedly has had some cold symptoms as well. But not GI symptoms. There is nothing she's eaten that she suspects triggered the symptoms. Past Medical History:  Diagnosis Date  . Asthma     There are no active problems to display for this patient.   Past Surgical History:  Procedure Laterality Date  . CHOLECYSTECTOMY    . HERNIA REPAIR      OB History    No data available       Home Medications    Prior to Admission medications   Medication Sig Start Date End Date Taking? Authorizing Provider  albuterol (PROVENTIL HFA;VENTOLIN HFA) 108 (90 BASE) MCG/ACT inhaler Inhale 1 puff into the lungs every 6 (six) hours as needed for wheezing or shortness of breath.    Historical Provider, MD  cephALEXin (KEFLEX) 500 MG capsule Take 1 capsule (500 mg total) by mouth 2 (two) times daily. 01/11/16   Shary Decamp, PA-C  fluconazole (DIFLUCAN) 150 MG tablet Take 1 tablet (150 mg total) by mouth daily. 03/11/16   Tanna Furry, MD  loperamide (IMODIUM) 2 MG capsule Take 1 capsule (2 mg total) by mouth 4 (four) times daily as needed for diarrhea or loose stools. 04/01/16   Charlesetta Shanks, MD  nitrofurantoin, macrocrystal-monohydrate, (MACROBID) 100 MG capsule Take 1 capsule (100 mg total) by mouth 2 (two) times daily. 03/11/16   Tanna Furry, MD    phenazopyridine (PYRIDIUM) 200 MG tablet Take 1 tablet (200 mg total) by mouth 3 (three) times daily. 03/11/16   Tanna Furry, MD  promethazine (PHENERGAN) 25 MG tablet Take 1 tablet (25 mg total) by mouth every 6 (six) hours as needed for nausea or vomiting. 04/01/16   Charlesetta Shanks, MD    Family History No family history on file.  Social History Social History  Substance Use Topics  . Smoking status: Never Smoker  . Smokeless tobacco: Never Used  . Alcohol use No     Allergies   Bactrim [sulfamethoxazole-trimethoprim]; Flagyl [metronidazole]; Morphine and related; Prednisone; Vicodin [hydrocodone-acetaminophen]; and Zofran [ondansetron hcl]   Review of Systems Review of Systems 10 Systems reviewed and are negative for acute change except as noted in the HPI.   Physical Exam Updated Vital Signs BP 133/86 (BP Location: Left Arm)   Pulse 93   Temp 98.5 F (36.9 C) (Oral)   Resp 20   LMP 03/11/2016   SpO2 96%   Physical Exam  Constitutional: She is oriented to person, place, and time. She appears well-developed and well-nourished. No distress.  HENT:  Head: Normocephalic and atraumatic.  Nose: Nose normal.  Mouth/Throat: Oropharynx is clear and moist.  Eyes: EOM are normal. No scleral icterus.  Cardiovascular: Normal rate, regular rhythm, normal heart sounds and intact distal pulses.   Pulmonary/Chest: Breath sounds normal.  Abdominal: Soft. Bowel sounds are normal. She exhibits no distension. There is no tenderness.  Musculoskeletal: Normal range of motion. She exhibits no edema or tenderness.  Neurological: She is alert and oriented to person, place, and time. She exhibits normal muscle tone. Coordination normal.  Skin: Skin is warm and dry.  Psychiatric: She has a normal mood and affect.     ED Treatments / Results  Labs (all labs ordered are listed, but only abnormal results are displayed) Labs Reviewed  BASIC METABOLIC PANEL - Abnormal; Notable for the  following:       Result Value   Creatinine, Ser 1.09 (*)    All other components within normal limits  CBC - Abnormal; Notable for the following:    MCV 101.7 (*)    All other components within normal limits  I-STAT TROPOININ, ED    EKG  EKG Interpretation None       Radiology Dg Chest 2 View  Result Date: 04/01/2016 CLINICAL DATA:  Chest pain, shortness of breath, fever for 2 days. History of asthma. EXAM: CHEST  2 VIEW COMPARISON:  Radiographs 02/2016 FINDINGS: Borderline bronchial thickening and hyperinflation. No confluent airspace disease. Heart size and mediastinal contours are normal. Question of calcified subcarinal lymph nodes. No pleural effusion or pneumothorax. Remote left rib fractures. IMPRESSION: Borderline bronchial thickening and hyperinflation can be seen in the setting of asthma. Electronically Signed   By: Jeb Levering M.D.   On: 04/01/2016 20:49    Procedures Procedures (including critical care time)  Medications Ordered in ED Medications  promethazine (PHENERGAN) injection 25 mg (not administered)  loperamide (IMODIUM) capsule 4 mg (not administered)     Initial Impression / Assessment and Plan / ED Course  I have reviewed the triage vital signs and the nursing notes.  Pertinent labs & imaging results that were available during my care of the patient were reviewed by me and considered in my medical decision making (see chart for details).  Clinical Course     Final Clinical Impressions(s) / ED Diagnoses   Final diagnoses:  Gastroenteritis  Patient is clinically nontoxic and well with stable vital signs. His had approximately 24 hours of vomiting and diarrhea. At this time I feel she can initiate outpatient Zofran and Imodium. Patient is counseled on signs and symptoms were to return.  New Prescriptions New Prescriptions   LOPERAMIDE (IMODIUM) 2 MG CAPSULE    Take 1 capsule (2 mg total) by mouth 4 (four) times daily as needed for diarrhea or  loose stools.   PROMETHAZINE (PHENERGAN) 25 MG TABLET    Take 1 tablet (25 mg total) by mouth every 6 (six) hours as needed for nausea or vomiting.     Charlesetta Shanks, MD 04/01/16 [phone]

## 2016-04-02 ENCOUNTER — Emergency Department (HOSPITAL_COMMUNITY)
Admission: EM | Admit: 2016-04-02 | Discharge: 2016-04-02 | Disposition: A | Discharge status: Home / Self Care | Payer: Self-pay | Attending: Emergency Medicine | Admitting: Emergency Medicine

## 2016-04-02 ENCOUNTER — Encounter (HOSPITAL_COMMUNITY): Payer: Self-pay | Admitting: Emergency Medicine

## 2016-04-02 DIAGNOSIS — R059 Cough, unspecified: Secondary | ICD-10-CM

## 2016-04-02 DIAGNOSIS — R109 Unspecified abdominal pain: Secondary | ICD-10-CM | POA: Insufficient documentation

## 2016-04-02 DIAGNOSIS — R1110 Vomiting, unspecified: Secondary | ICD-10-CM

## 2016-04-02 DIAGNOSIS — Z7951 Long term (current) use of inhaled steroids: Secondary | ICD-10-CM | POA: Insufficient documentation

## 2016-04-02 DIAGNOSIS — J45909 Unspecified asthma, uncomplicated: Secondary | ICD-10-CM | POA: Insufficient documentation

## 2016-04-02 DIAGNOSIS — R197 Diarrhea, unspecified: Secondary | ICD-10-CM

## 2016-04-02 DIAGNOSIS — L731 Pseudofolliculitis barbae: Secondary | ICD-10-CM | POA: Insufficient documentation

## 2016-04-02 DIAGNOSIS — Z79891 Long term (current) use of opiate analgesic: Secondary | ICD-10-CM | POA: Insufficient documentation

## 2016-04-02 DIAGNOSIS — R05 Cough: Secondary | ICD-10-CM | POA: Insufficient documentation

## 2016-04-02 DIAGNOSIS — R112 Nausea with vomiting, unspecified: Secondary | ICD-10-CM | POA: Insufficient documentation

## 2016-04-02 LAB — COMPREHENSIVE METABOLIC PANEL
ALT: 15 U/L (ref 14–54)
AST: 23 U/L (ref 15–41)
Albumin: 4.5 g/dL (ref 3.5–5.0)
Alkaline Phosphatase: 68 U/L (ref 38–126)
Anion gap: 7 (ref 5–15)
BILIRUBIN TOTAL: 0.5 mg/dL (ref 0.3–1.2)
BUN: 14 mg/dL (ref 6–20)
CALCIUM: 9 mg/dL (ref 8.9–10.3)
CHLORIDE: 108 mmol/L (ref 101–111)
CO2: 20 mmol/L — ABNORMAL LOW (ref 22–32)
CREATININE: 0.87 mg/dL (ref 0.44–1.00)
Glucose, Bld: 90 mg/dL (ref 65–99)
Potassium: 4.2 mmol/L (ref 3.5–5.1)
Sodium: 135 mmol/L (ref 135–145)
TOTAL PROTEIN: 7.7 g/dL (ref 6.5–8.1)

## 2016-04-02 LAB — CBC
HCT: 43 % (ref 36.0–46.0)
Hemoglobin: 14.5 g/dL (ref 12.0–15.0)
MCH: 33.9 pg (ref 26.0–34.0)
MCHC: 33.7 g/dL (ref 30.0–36.0)
MCV: 100.5 fL — ABNORMAL HIGH (ref 78.0–100.0)
PLATELETS: 241 10*3/uL (ref 150–400)
RBC: 4.28 MIL/uL (ref 3.87–5.11)
RDW: 12.9 % (ref 11.5–15.5)
WBC: 5.6 10*3/uL (ref 4.0–10.5)

## 2016-04-02 LAB — URINALYSIS, ROUTINE W REFLEX MICROSCOPIC
Bilirubin Urine: NEGATIVE
GLUCOSE, UA: NEGATIVE mg/dL
Hgb urine dipstick: NEGATIVE
KETONES UR: 15 mg/dL — AB
LEUKOCYTES UA: NEGATIVE
NITRITE: NEGATIVE
PROTEIN: NEGATIVE mg/dL
Specific Gravity, Urine: 1.032 — ABNORMAL HIGH (ref 1.005–1.030)
pH: 6 (ref 5.0–8.0)

## 2016-04-02 LAB — POC URINE PREG, ED: PREG TEST UR: NEGATIVE

## 2016-04-02 LAB — LIPASE, BLOOD: LIPASE: 29 U/L (ref 11–51)

## 2016-04-02 MED ORDER — SODIUM CHLORIDE 0.9 % IV BOLUS (SEPSIS)
1000.0000 mL | Freq: Once | INTRAVENOUS | Status: AC
  Administered 2016-04-02: 1000 mL via INTRAVENOUS

## 2016-04-02 MED ORDER — ONDANSETRON HCL 4 MG/2ML IJ SOLN
4.0000 mg | Freq: Once | INTRAMUSCULAR | Status: DC
  Filled 2016-04-02: qty 2

## 2016-04-02 MED ORDER — METOCLOPRAMIDE HCL 5 MG/ML IJ SOLN
10.0000 mg | Freq: Once | INTRAMUSCULAR | Status: AC
  Administered 2016-04-02: 10 mg via INTRAVENOUS
  Filled 2016-04-02: qty 2

## 2016-04-02 NOTE — Progress Notes (Signed)
Patient noted to have been seen in the ED 9 times within the last six months, no insurance no pcp listed.  EDCM went to speak to patient at bedside, however patient not in her room currently.

## 2016-04-02 NOTE — ED Provider Notes (Signed)
[facility] DEPT Provider Note   CSN: [id number] Arrival date & time: 04/02/16  1519  First Provider Contact:  First MD Initiated Contact with Patient 04/02/16 1820        History   Chief Complaint Chief Complaint  Patient presents with  . Abdominal Pain  . Rash    HPI Kelsey Maynard is a 40 y.o. female.  40 year old female who presents with vomiting, diarrhea, and cough. 3 days ago, the patient began having vomiting and diarrhea. She was evaluated yesterday at [facility] and was discharged home with Phenergan and an IDDM, which she states that she has not been able to take because of ongoing vomiting. She reports that she has had lower abdominal pain associated with these symptoms, however note yesterday documents endorsement of no abdominal pain. She reports some mild vaginal discharge. No urinary symptoms. She has had a urinary tract infection recently and completed antibiotics approximately 1-2 weeks ago. For the past one day, she has had a cough productive of yellow phlegm as well as nasal congestion. No sick contacts, recent travel, or unusual foods. MAXIMUM TEMPERATURE was 100 at home today. She has taken NyQuil, Motrin, and Tylenol without relief. Patient also reports 2 bumps on her skin, one in the left inguinal area and one on the right buttock. The areas are sore, she has not noticed any drainage.   The history is provided by the patient.  Abdominal Pain    Rash      Past Medical History:  Diagnosis Date  . Asthma     There are no active problems to display for this patient.   Past Surgical History:  Procedure Laterality Date  . CHOLECYSTECTOMY    . HERNIA REPAIR      OB History    No data available       Home Medications    Prior to Admission medications   Medication Sig Start Date End Date Taking? Authorizing Provider  acetaminophen (TYLENOL) 500 MG tablet Take 1,000 mg by mouth every 6 (six) hours as needed for mild pain, moderate pain, fever or  headache.   Yes Historical Provider, MD  albuterol (PROVENTIL HFA;VENTOLIN HFA) 108 (90 BASE) MCG/ACT inhaler Inhale 1-2 puffs into the lungs every 6 (six) hours as needed for wheezing or shortness of breath.    Yes Historical Provider, MD    Family History No family history on file.  Social History Social History  Substance Use Topics  . Smoking status: Never Smoker  . Smokeless tobacco: Never Used  . Alcohol use No     Allergies   Prednisone; Bactrim [sulfamethoxazole-trimethoprim]; Flagyl [metronidazole]; Morphine and related; Vicodin [hydrocodone-acetaminophen]; and Zofran [ondansetron hcl]   Review of Systems Review of Systems  Gastrointestinal: Positive for abdominal pain.  Skin: Positive for rash.   10 Systems reviewed and are negative for acute change except as noted in the HPI.   Physical Exam Updated Vital Signs BP 110/62   Pulse 105   Temp 99.3 F (37.4 C) (Oral)   Resp 18   LMP 03/11/2016   SpO2 99%   Physical Exam  Constitutional: She is oriented to person, place, and time. She appears well-developed and well-nourished. No distress.  HENT:  Head: Normocephalic and atraumatic.  Mouth/Throat: No oropharyngeal exudate.  Moist mucous membranes  Eyes: Conjunctivae are normal. Pupils are equal, round, and reactive to light.  Neck: Neck supple.  Cardiovascular: Normal rate, regular rhythm and normal heart sounds.   No murmur heard. Pulmonary/Chest: Effort normal  and breath sounds normal.  Occasional cough  Abdominal: Soft. Bowel sounds are normal. She exhibits no distension. There is no tenderness.  Musculoskeletal: She exhibits no edema.  Lymphadenopathy:    She has no cervical adenopathy.  Neurological: She is alert and oriented to person, place, and time.  Fluent speech  Skin: Skin is warm and dry.  Small superficial pustule in L inguinal area at hairline and small healing, ruptured pustule on R buttock; no induration or fluctuance.  Psychiatric: She  has a normal mood and affect. Judgment normal.  Nursing note and vitals reviewed.  Chaperone was present during exam.   ED Treatments / Results  Labs (all labs ordered are listed, but only abnormal results are displayed) Labs Reviewed  COMPREHENSIVE METABOLIC PANEL - Abnormal; Notable for the following:       Result Value   CO2 20 (*)    All other components within normal limits  CBC - Abnormal; Notable for the following:    MCV 100.5 (*)    All other components within normal limits  URINALYSIS, ROUTINE W REFLEX MICROSCOPIC (NOT AT [facility]) - Abnormal; Notable for the following:    APPearance CLOUDY (*)    Specific Gravity, Urine 1.032 (*)    Ketones, ur 15 (*)    All other components within normal limits  LIPASE, BLOOD  POC URINE PREG, ED    EKG  EKG Interpretation None       Radiology Dg Chest 2 View  Result Date: 04/01/2016 CLINICAL DATA:  Chest pain, shortness of breath, fever for 2 days. History of asthma. EXAM: CHEST  2 VIEW COMPARISON:  Radiographs 02/2016 FINDINGS: Borderline bronchial thickening and hyperinflation. No confluent airspace disease. Heart size and mediastinal contours are normal. Question of calcified subcarinal lymph nodes. No pleural effusion or pneumothorax. Remote left rib fractures. IMPRESSION: Borderline bronchial thickening and hyperinflation can be seen in the setting of asthma. Electronically Signed   By: Jeb Levering M.D.   On: 04/01/2016 20:49    Procedures Procedures (including critical care time)  Medications Ordered in ED Medications  sodium chloride 0.9 % bolus 1,000 mL (0 mLs Intravenous Stopped 04/02/16 2029)  metoCLOPramide (REGLAN) injection 10 mg (10 mg Intravenous Given 04/02/16 1939)     Initial Impression / Assessment and Plan / ED Course  I have reviewed the triage vital signs and the nursing notes.  Pertinent labs that were available during my care of the patient were reviewed by me and considered in my medical decision  making (see chart for details).  Clinical Course   Pt w/ 3 days of vomiting and diarrhea, 1 day of productive cough and nasal congestion. She was evaluated yesterday and discharged after chest x-ray and lab work was unremarkable. She was awake and alert, nontoxic on exam. Vital signs unremarkable. Normal work of breathing and no abnormal lung sounds. She had a small pustule in her left inguinal area consistent with an ingrown hair follicle and a healing pustule on her right buttock consistent with a pimple. Neither area w/ induration or fluctuance to suggest abscess. Instructed on warm compresses and avoidance of shaving in her bikini area.  Regarding vomiting and diarrhea, her lab work here including blood counts, creatinine, and LFTs are normal. Gave the patient Zofran and an IV fluid bolus, after which she was able to tolerate drinks w/ no vomiting in the ED. Because of her vaginal discharge, I recommended pelvic exam, however she later refused and stated she would like to try  OTC yeast infection treatment as this is similar to previous yeast infection. Given no abdominal tenderness and no evidence of dehydration, I feel she is safe for discharge. I have reviewed supportive care as well as return precautions and instructed to follow-up with women's clinic if her vaginal discharge does not resolve with these infection treatment. Patient voiced understanding was discharged in satisfactory condition.  Final Clinical Impressions(s) / ED Diagnoses   Final diagnoses:  Vomiting and diarrhea  Cough  Ingrown hair    New Prescriptions Discharge Medication List as of 04/02/2016  8:31 PM       Sharlett Iles, MD 04/03/16 0030

## 2016-04-02 NOTE — ED Notes (Signed)
Pt was given ginger ale for po challenge. 

## 2016-04-02 NOTE — Discharge Instructions (Signed)
Use over-the-counter Monistat for your vaginal discharge and follow-up with the [facility] if it does not improve. Use warm compresses on your ingrown hair follicle.

## 2016-04-02 NOTE — ED Notes (Signed)
Two unsuccessful IV attempts.

## 2016-04-02 NOTE — ED Notes (Signed)
Pt wishes to go home at this time--- has verbalized that she "feels better".  EDP was made aware.

## 2016-04-02 NOTE — ED Triage Notes (Addendum)
Pt from home with complaints of cough with yellow sputum x 1 day, lower abdominal pain x 3 days with nausea, emesis, and diarrhea (5 episodes today). Pt denies urinary symptoms. Pt states she is unable to hold down food or liquids. Pt also has complaints of "bumps" one on her left inguinal area and 1 on her right buttock area. Areas are raised and reddened. Pt denies drainage from these areas. Pt denies CP.  Pt was given imodium and phenergan with no relief

## 2016-06-04 ENCOUNTER — Encounter (HOSPITAL_COMMUNITY): Payer: Self-pay | Admitting: Emergency Medicine

## 2016-06-04 DIAGNOSIS — J45909 Unspecified asthma, uncomplicated: Secondary | ICD-10-CM | POA: Insufficient documentation

## 2016-06-04 DIAGNOSIS — Z79899 Other long term (current) drug therapy: Secondary | ICD-10-CM | POA: Insufficient documentation

## 2016-06-04 DIAGNOSIS — L0231 Cutaneous abscess of buttock: Secondary | ICD-10-CM | POA: Insufficient documentation

## 2016-06-04 NOTE — ED Triage Notes (Signed)
Pt from home with complaints of an abscess on the right side of her buttock. Pt states the abscess appeared today. Pt is unaware of any drainage.

## 2016-06-05 ENCOUNTER — Emergency Department (HOSPITAL_COMMUNITY)
Admission: EM | Admit: 2016-06-05 | Discharge: 2016-06-05 | Disposition: A | Discharge status: Home / Self Care | Payer: Self-pay | Attending: Emergency Medicine | Admitting: Emergency Medicine

## 2016-06-05 DIAGNOSIS — L0231 Cutaneous abscess of buttock: Secondary | ICD-10-CM

## 2016-06-05 MED ORDER — LIDOCAINE-EPINEPHRINE (PF) 2 %-1:200000 IJ SOLN
10.0000 mL | Freq: Once | INTRAMUSCULAR | Status: AC
  Administered 2016-06-05: 10 mL
  Filled 2016-06-05: qty 10

## 2016-06-05 MED ORDER — DOXYCYCLINE HYCLATE 100 MG PO CAPS: 100.0000 mg | ORAL_CAPSULE | Freq: Two times a day (BID) | ORAL | 0 refills | Status: DC

## 2016-06-05 MED ORDER — NAPROXEN 500 MG PO TABS: 500.0000 mg | ORAL_TABLET | Freq: Two times a day (BID) | ORAL | 0 refills | Status: DC

## 2016-06-05 MED ORDER — DOXYCYCLINE HYCLATE 100 MG PO TABS
100.0000 mg | ORAL_TABLET | Freq: Once | ORAL | Status: AC
  Administered 2016-06-05: 100 mg via ORAL
  Filled 2016-06-05: qty 1

## 2016-06-05 NOTE — ED Notes (Signed)
Patient d/c'd self care.  F/U and medications reviewed.  Patient verbalized understanding. 

## 2016-06-05 NOTE — ED Notes (Signed)
Lido and tray at bedside.

## 2016-06-05 NOTE — ED Provider Notes (Signed)
[facility] DEPT Provider Note   CSN: [id number] Arrival date & time: 06/04/16  2245  By signing my name below, I, Dora Sims, attest that this documentation has been prepared under the direction and in the presence [employer], PA-C. Electronically Signed: Dora Sims, Scribe. 06/05/2016. 1:51 AM.  History   Chief Complaint Chief Complaint  Patient presents with  . Abscess    The history is provided by the patient. No language interpreter was used.     HPI Comments: Kelsey Maynard is a 40 y.o. female who presents to the Emergency Department complaining of sudden onset, constant, worsening, painful abscess to her right buttock beginning two days ago. Pt reports the abscess drained on its own and then came back larger in size yesterday morning. She endorses pain exacerbation with sitting down. She notes she has experienced abscesses in the past. Pt notes associated fever and has been taking motrin; she states her highest measured temperature was 101 and she last took motrin yesterday morning around 730 AM. Pt notes she went to [facility] yesterday afternoon for her abscess but left because it was too busy. She denies numbness, weakness, or any other associated symptoms.  Past Medical History:  Diagnosis Date  . Asthma     There are no active problems to display for this patient.   Past Surgical History:  Procedure Laterality Date  . CHOLECYSTECTOMY    . HERNIA REPAIR      OB History    No data available       Home Medications    Prior to Admission medications   Medication Sig Start Date End Date Taking? Authorizing Provider  albuterol (PROVENTIL HFA;VENTOLIN HFA) 108 (90 BASE) MCG/ACT inhaler Inhale 1-2 puffs into the lungs every 6 (six) hours as needed for wheezing or shortness of breath.    Yes Historical Provider, MD    Family History No family history on file.  Social History Social History  Substance Use Topics  . Smoking status: Never Smoker   . Smokeless tobacco: Never Used  . Alcohol use No     Allergies   Prednisone; Bactrim [sulfamethoxazole-trimethoprim]; Flagyl [metronidazole]; Morphine and related; Vicodin [hydrocodone-acetaminophen]; and Zofran [ondansetron hcl]   Review of Systems Review of Systems A complete 10 system review of systems was obtained and all systems are negative except as noted in the HPI and PMH.    Physical Exam Updated Vital Signs BP 101/69 (BP Location: Left Arm)   Pulse 91   Temp 98.4 F (36.9 C) (Oral)   Resp 16   Ht 5\' 8"  (1.727 m)   Wt 215 lb (97.5 kg)   SpO2 98%   BMI 32.69 kg/m   Physical Exam  Constitutional: She is oriented to person, place, and time. She appears well-developed and well-nourished. No distress.  Nontoxic appearing and in NAD  HENT:  Head: Normocephalic and atraumatic.  Eyes: Conjunctivae and EOM are normal. No scleral icterus.  Neck: Normal range of motion.  Cardiovascular: Normal rate, regular rhythm and intact distal pulses.   Pulmonary/Chest: Effort normal. No respiratory distress.  Respirations even and unlabored  Musculoskeletal: Normal range of motion.       Legs: Neurological: She is alert and oriented to person, place, and time.  Skin: Skin is warm and dry. No rash noted. She is not diaphoretic. There is erythema. No pallor.  See MSK  Psychiatric: She has a normal mood and affect. Her behavior is normal.  Nursing note and vitals reviewed.  ED Treatments / Results  Labs (all labs ordered are listed, but only abnormal results are displayed) Labs Reviewed - No data to display  EKG  EKG Interpretation None       Radiology No results found.  Procedures Procedures (including critical care time)  DIAGNOSTIC STUDIES: Oxygen Saturation is 98% on RA, normal by my interpretation.    COORDINATION OF CARE: 1:56 AM Discussed treatment plan with pt at bedside and pt agreed to plan.  Medications Ordered in ED Medications - No data to  display   INCISION AND DRAINAGE Performed by: Antonietta Breach Consent: Verbal consent obtained. Risks and benefits: risks, benefits and alternatives were discussed Type: abscess  Body area: R buttock  Anesthesia: local infiltration  Incision was made with a scalpel.  Local anesthetic: lidocaine 2% with epinephrine  Anesthetic total: 3 ml  Complexity: complex Blunt dissection to break up loculations  Drainage: purulent and bloody  Drainage amount: small to moderate  Packing material: none  Patient tolerance: Patient tolerated the procedure well with no immediate complications.     Initial Impression / Assessment and Plan / ED Course  I have reviewed the triage vital signs and the nursing notes.  Pertinent labs & imaging results that were available during my care of the patient were reviewed by me and considered in my medical decision making (see chart for details).  Clinical Course    Patient with skin abscess amenable to incision and drainage. Abscess was not large enough to warrant packing or drain. Encouraged home warm soaks and flushing. Mild signs of cellulitis is surrounding skin. Will d/c to home with doxycycline given reported hx of fever, though patient afebrile in the ED. Return precautions discussed and provided. Patient discharged in satisfactory condition with no unaddressed concerns.   Final Clinical Impressions(s) / ED Diagnoses   Final diagnoses:  Abscess of buttock, right    New Prescriptions New Prescriptions   No medications on file    I personally performed the services described in this documentation, which was scribed in my presence. The recorded information has been reviewed and is accurate.      Antonietta Breach, PA-C 06/05/16 0310    Shanon Rosser, MD 06/05/16 [phone]

## 2016-06-05 NOTE — Discharge Instructions (Signed)
Apply warm compresses to the area or soak in a tub 3-4 times per day to prevent drainage. Take the antibiotics prescribed to you until finished. You may take naproxen for pain as needed. Follow-up for wound recheck in 2 days if no improvement in symptoms.

## 2016-06-08 ENCOUNTER — Encounter (HOSPITAL_COMMUNITY): Payer: Self-pay

## 2016-06-08 ENCOUNTER — Emergency Department (HOSPITAL_COMMUNITY)
Admission: EM | Admit: 2016-06-08 | Discharge: 2016-06-08 | Disposition: A | Discharge status: Home / Self Care | Payer: Self-pay | Attending: Emergency Medicine | Admitting: Emergency Medicine

## 2016-06-08 DIAGNOSIS — Z4801 Encounter for change or removal of surgical wound dressing: Secondary | ICD-10-CM | POA: Insufficient documentation

## 2016-06-08 DIAGNOSIS — J45909 Unspecified asthma, uncomplicated: Secondary | ICD-10-CM | POA: Insufficient documentation

## 2016-06-08 DIAGNOSIS — Z79899 Other long term (current) drug therapy: Secondary | ICD-10-CM | POA: Insufficient documentation

## 2016-06-08 DIAGNOSIS — Z5189 Encounter for other specified aftercare: Secondary | ICD-10-CM

## 2016-06-08 DIAGNOSIS — Z7951 Long term (current) use of inhaled steroids: Secondary | ICD-10-CM | POA: Insufficient documentation

## 2016-06-08 NOTE — ED Triage Notes (Signed)
Pt states that she got an abscess drained on her R buttock 3 days ago. She states that she was told to come back for a wound check. Pt states that she does not think that it is healing. Pt has been taking her antibiotics as prescribed. Denies any increase in drainage or pain. A%Ox4. Ambulatory.

## 2016-06-08 NOTE — ED Provider Notes (Signed)
[facility] DEPT Provider Note   CSN: [id number] Arrival date & time: 06/08/16  2031     History   Chief Complaint Chief Complaint  Patient presents with  . Wound Check    HPI Kelsey Maynard is a 41 y.o. female.  HPI   Had I&D 3 days ago, right gluteal abscess. No fevers, no n/v, no increasing pain. Feels like wound still open, concerned not healing. Mild pain.   Past Medical History:  Diagnosis Date  . Asthma     There are no active problems to display for this patient.   Past Surgical History:  Procedure Laterality Date  . CHOLECYSTECTOMY    . HERNIA REPAIR      OB History    No data available       Home Medications    Prior to Admission medications   Medication Sig Start Date End Date Taking? Authorizing Provider  albuterol (PROVENTIL HFA;VENTOLIN HFA) 108 (90 BASE) MCG/ACT inhaler Inhale 1-2 puffs into the lungs every 6 (six) hours as needed for wheezing or shortness of breath.     Historical Provider, MD  doxycycline (VIBRAMYCIN) 100 MG capsule Take 1 capsule (100 mg total) by mouth 2 (two) times daily. 06/05/16   Antonietta Breach, PA-C  naproxen (NAPROSYN) 500 MG tablet Take 1 tablet (500 mg total) by mouth 2 (two) times daily. 06/05/16   Antonietta Breach, PA-C    Family History History reviewed. No pertinent family history.  Social History Social History  Substance Use Topics  . Smoking status: Never Smoker  . Smokeless tobacco: Never Used  . Alcohol use No     Allergies   Prednisone; Bactrim [sulfamethoxazole-trimethoprim]; Flagyl [metronidazole]; Morphine and related; Vicodin [hydrocodone-acetaminophen]; and Zofran [ondansetron hcl]   Review of Systems Review of Systems  Constitutional: Negative for fever.  Respiratory: Negative for shortness of breath.   Cardiovascular: Negative for chest pain.  Gastrointestinal: Negative for nausea and vomiting.  Skin: Positive for wound.     Physical Exam Updated Vital Signs BP 128/73 (BP Location:  Left Arm)   Pulse 60   Temp 98.8 F (37.1 C)   Resp 16   Ht 5\' 8"  (1.727 m)   Wt 216 lb 8 oz (98.2 kg)   SpO2 100%   BMI 32.92 kg/m   Physical Exam  Constitutional: She is oriented to person, place, and time. She appears well-developed and well-nourished. No distress.  HENT:  Head: Normocephalic and atraumatic.  Eyes: Conjunctivae are normal.  Neck: Normal range of motion.  Cardiovascular: Normal rate, regular rhythm and intact distal pulses.   Pulmonary/Chest: Effort normal. No respiratory distress.  Musculoskeletal: She exhibits no edema or tenderness.  Neurological: She is alert and oriented to person, place, and time.  Skin: Skin is warm and dry. No rash noted. She is not diaphoretic. No erythema.  2cm incision right buttock  Nursing note and vitals reviewed.    ED Treatments / Results  Labs (all labs ordered are listed, but only abnormal results are displayed) Labs Reviewed - No data to display  EKG  EKG Interpretation None       Radiology No results found.  Procedures Procedures (including critical care time)  Medications Ordered in ED Medications - No data to display   Initial Impression / Assessment and Plan / ED Course  I have reviewed the triage vital signs and the nursing notes.  Pertinent labs & imaging results that were available during my care of the patient were reviewed by me and  considered in my medical decision making (see chart for details).  Clinical Course   40yo female presents for wound check after incision and drainage of gluteal abscess 3 days ago. Wound appears clean, no surrounding erythema, no abscess on exam.  Recommend continued wound care. Patient discharged in stable condition with understanding of reasons to return.   Final Clinical Impressions(s) / ED Diagnoses   Final diagnoses:  Visit for wound check    New Prescriptions Discharge Medication List as of 06/08/2016 11:17 PM       Gareth Morgan, MD 06/09/16  2009

## 2016-06-10 ENCOUNTER — Encounter (HOSPITAL_COMMUNITY): Payer: Self-pay | Admitting: Emergency Medicine

## 2016-06-10 ENCOUNTER — Emergency Department (HOSPITAL_COMMUNITY)
Admission: EM | Admit: 2016-06-10 | Discharge: 2016-06-10 | Disposition: A | Discharge status: Home / Self Care | Payer: Self-pay | Attending: Emergency Medicine | Admitting: Emergency Medicine

## 2016-06-10 DIAGNOSIS — Z79899 Other long term (current) drug therapy: Secondary | ICD-10-CM | POA: Insufficient documentation

## 2016-06-10 DIAGNOSIS — J069 Acute upper respiratory infection, unspecified: Secondary | ICD-10-CM | POA: Insufficient documentation

## 2016-06-10 DIAGNOSIS — J45909 Unspecified asthma, uncomplicated: Secondary | ICD-10-CM | POA: Insufficient documentation

## 2016-06-10 DIAGNOSIS — R0981 Nasal congestion: Secondary | ICD-10-CM

## 2016-06-10 DIAGNOSIS — R0789 Other chest pain: Secondary | ICD-10-CM | POA: Insufficient documentation

## 2016-06-10 MED ORDER — BENZONATATE 100 MG PO CAPS: 100.0000 mg | ORAL_CAPSULE | Freq: Three times a day (TID) | ORAL | 0 refills | Status: DC | PRN

## 2016-06-10 MED ORDER — FLUTICASONE PROPIONATE 50 MCG/ACT NA SUSP: 2.0000 | Freq: Every day | NASAL | 0 refills | Status: DC

## 2016-06-10 MED ORDER — CETIRIZINE-PSEUDOEPHEDRINE ER 5-120 MG PO TB12: 1.0000 | ORAL_TABLET | Freq: Every day | ORAL | 0 refills | Status: DC

## 2016-06-10 MED ORDER — IPRATROPIUM-ALBUTEROL 0.5-2.5 (3) MG/3ML IN SOLN
3.0000 mL | Freq: Once | RESPIRATORY_TRACT | Status: AC
  Administered 2016-06-10: 3 mL via RESPIRATORY_TRACT
  Filled 2016-06-10: qty 3

## 2016-06-10 MED ORDER — ALBUTEROL SULFATE HFA 108 (90 BASE) MCG/ACT IN AERS
1.0000 | INHALATION_SPRAY | Freq: Four times a day (QID) | RESPIRATORY_TRACT | 0 refills | Status: DC | PRN
Start: 2016-06-10 — End: 2021-07-08

## 2016-06-10 NOTE — ED Provider Notes (Signed)
[facility] DEPT Provider Note   CSN: [id number] Arrival date & time: 06/10/16  1408  By signing my name below, I, Soijett Blue, attest that this documentation has been prepared under the direction and in the presence of Bodey Frizell Y. Telma Pyeatt, PA-C Electronically Signed: Soijett Blue, ED Scribe. 06/10/16. 3:21 PM.   History   Chief Complaint Chief Complaint  Patient presents with  . Nasal Congestion    HPI Kelsey Maynard is a 40 y.o. female with a PMHx of asthma, who presents to the Emergency Department complaining of nasal congestion onset today. Pt denies sick contacts. She states that she is having associated symptoms of dry cough x today, chills, and chest tightness. She states that she has tried dayquil, claritin, albuterol inhaler with her last dose being last night with no relief for her symptoms. She denies fever, rhinorrhea, body aches, and any other symptoms.   The history is provided by the patient. No language interpreter was used.    Past Medical History:  Diagnosis Date  . Asthma     There are no active problems to display for this patient.   Past Surgical History:  Procedure Laterality Date  . CHOLECYSTECTOMY    . HERNIA REPAIR      OB History    No data available       Home Medications    Prior to Admission medications   Medication Sig Start Date End Date Taking? Authorizing Provider  albuterol (PROVENTIL HFA;VENTOLIN HFA) 108 (90 BASE) MCG/ACT inhaler Inhale 1-2 puffs into the lungs every 6 (six) hours as needed for wheezing or shortness of breath.     Historical Provider, MD  doxycycline (VIBRAMYCIN) 100 MG capsule Take 1 capsule (100 mg total) by mouth 2 (two) times daily. 06/05/16   Antonietta Breach, PA-C  naproxen (NAPROSYN) 500 MG tablet Take 1 tablet (500 mg total) by mouth 2 (two) times daily. 06/05/16   Antonietta Breach, PA-C    Family History No family history on file.  Social History Social History  Substance Use Topics  . Smoking status: Never  Smoker  . Smokeless tobacco: Never Used  . Alcohol use No     Allergies   Prednisone; Bactrim [sulfamethoxazole-trimethoprim]; Flagyl [metronidazole]; Morphine and related; Vicodin [hydrocodone-acetaminophen]; and Zofran [ondansetron hcl]   Review of Systems Review of Systems  Constitutional: Positive for chills. Negative for fever.  HENT: Positive for congestion. Negative for rhinorrhea.   Respiratory: Positive for cough (dry) and chest tightness.   Musculoskeletal: Negative for myalgias.  Skin: Negative for color change, rash and wound.  All other systems reviewed and are negative.   Physical Exam Updated Vital Signs BP 119/79 (BP Location: Right Arm)   Temp 98.3 F (36.8 C) (Oral)   Resp 17   SpO2 100%   Physical Exam  Constitutional: She is oriented to person, place, and time. She appears well-developed and well-nourished. No distress.  HENT:  Head: Normocephalic and atraumatic.  Nose: Mucosal edema present.  Mouth/Throat: Uvula is midline, oropharynx is clear and moist and mucous membranes are normal.  Eyes: EOM are normal.  Neck: Neck supple.  Cardiovascular: Normal rate, regular rhythm and normal heart sounds.  Exam reveals no gallop and no friction rub.   No murmur heard. Pulmonary/Chest: Effort normal. No respiratory distress. She has decreased breath sounds. She has no wheezes. She has no rales.  Diminished breath sounds throughout  Abdominal: She exhibits no distension.  Musculoskeletal: Normal range of motion.  Neurological: She is alert and oriented to  person, place, and time.  Skin: Skin is warm and dry.  Psychiatric: She has a normal mood and affect. Her behavior is normal.  Nursing note and vitals reviewed.    ED Treatments / Results  DIAGNOSTIC STUDIES: Oxygen Saturation is 100% on RA, nl by my interpretation.    COORDINATION OF CARE: 3:08 PM Discussed treatment plan with pt at bedside which includes breathing treatment, albuterol inhaler, and pt  agreed to plan.   Procedures Procedures (including critical care time)  Medications Ordered in ED Medications  ipratropium-albuterol (DUONEB) 0.5-2.5 (3) MG/3ML nebulizer solution 3 mL (3 mLs Nebulization Given 06/10/16 1519)     Initial Impression / Assessment and Plan / ED Course  I have reviewed the triage vital signs and the nursing notes.   Clinical Course    Pt lung sounds improved after duoneb. Suspect viral URI. She is afebrile, not hypoxic, and breathing comfortably. Will refill albuterol inhaler and give rx for supportive meds. Pt notes she gets hives with prednisone (chart documents anaphylaxis) but states she thinks she's taken flonase before. Encouraged monitoring for adverse effects. Encouraged PCP f/u. ER return precautions given.  Final Clinical Impressions(s) / ED Diagnoses   Final diagnoses:  Upper respiratory tract infection, unspecified type  Nasal congestion    New Prescriptions Discharge Medication List as of 06/10/2016  4:01 PM    START taking these medications   Details  !! albuterol (PROVENTIL HFA;VENTOLIN HFA) 108 (90 Base) MCG/ACT inhaler Inhale 1-2 puffs into the lungs every 6 (six) hours as needed for wheezing or shortness of breath., Starting Thu 06/10/2016, Print    benzonatate (TESSALON) 100 MG capsule Take 1 capsule (100 mg total) by mouth 3 (three) times daily as needed for cough., Starting Thu 06/10/2016, Print    cetirizine-pseudoephedrine (ZYRTEC-D) 5-120 MG tablet Take 1 tablet by mouth daily., Starting Thu 06/10/2016, Print    fluticasone (FLONASE) 50 MCG/ACT nasal spray Place 2 sprays into both nostrils daily., Starting Thu 06/10/2016, Print     !! - Potential duplicate medications found. Please discuss with provider.     I personally performed the services described in this documentation, which was scribed in my presence. The recorded information has been reviewed and is accurate.    Anne Ng, PA-C 06/10/16 1716      Blanchie Dessert, MD 06/15/16 2032

## 2016-06-10 NOTE — Discharge Instructions (Signed)
Take medication as prescribed. As we discussed you should tolerate Flonase fine but stop using immediately if you develop a rash, throat itching/swelling, or other adverse effects. Please follow up with your primary care provider. Return to the ER for new or worsening symptoms.

## 2016-06-10 NOTE — ED Triage Notes (Signed)
Pt reports nasal congestion and cough started today. denies fever chills nausea nor abd pain .

## 2016-07-02 ENCOUNTER — Encounter (HOSPITAL_COMMUNITY): Payer: Self-pay | Admitting: Emergency Medicine

## 2016-07-02 ENCOUNTER — Emergency Department (HOSPITAL_COMMUNITY)
Admission: EM | Admit: 2016-07-02 | Discharge: 2016-07-02 | Disposition: A | Discharge status: Home / Self Care | Payer: Self-pay | Attending: Emergency Medicine | Admitting: Emergency Medicine

## 2016-07-02 DIAGNOSIS — R21 Rash and other nonspecific skin eruption: Secondary | ICD-10-CM | POA: Insufficient documentation

## 2016-07-02 DIAGNOSIS — J45909 Unspecified asthma, uncomplicated: Secondary | ICD-10-CM | POA: Insufficient documentation

## 2016-07-02 MED ORDER — HYDROCORTISONE 1 % EX CREA: TOPICAL_CREAM | CUTANEOUS | 0 refills | Status: DC

## 2016-07-02 NOTE — Discharge Instructions (Signed)
Apply hydrocortisone cream twice a day, as needed on rash Return if rash starts to burn, hurt, expand despite cream

## 2016-07-02 NOTE — ED Provider Notes (Signed)
[facility] DEPT Provider Note   By signing my name below, I, Bea Graff, attest that this documentation has been prepared under the direction and in the presence of Carmon Sails, PA-C. Electronically Signed: Bea Graff, ED Scribe. 07/02/16. 11:44 AM.   History   Chief Complaint Chief Complaint  Patient presents with  . Rash    The history is provided by the patient and medical records. No language interpreter was used.    HPI Comments:  Kelsey Maynard is an obese 40 y.o. female who presents to the Emergency Department complaining of to the right posterior upper arm that appeared last night. She states the rash began as single cluster of spots that has gotten worse. Pt reports the area was slightly itchy at first but denies any itching right now. Pt states the area was burning slightly after she showered but reports that has since subsided. She states she laid across her boyfriend's bed yesterday and is concerned with bed bugs, although she denies her boyfriend having any rash. She denies any new soaps, detergents, medications, lotions or any other personal hygiene products. She applied Aveeno lotion to the area with no significant relief of the rash. She denies alleviating factors. She denies fever, chills, cough, sneezing, rhinorrhea, CP, SOB, nausea, vomiting, abdominal pain. She denies the rash is anywhere else. She denies contact with anyone with similar symptoms.    Past Medical History:  Diagnosis Date  . Asthma     There are no active problems to display for this patient.   Past Surgical History:  Procedure Laterality Date  . CHOLECYSTECTOMY    . HERNIA REPAIR      OB History    No data available       Home Medications    Prior to Admission medications   Medication Sig Start Date End Date Taking? Authorizing Provider  albuterol (PROVENTIL HFA;VENTOLIN HFA) 108 (90 BASE) MCG/ACT inhaler Inhale 1-2 puffs into the lungs every 6 (six) hours as needed  for wheezing or shortness of breath.     Historical Provider, MD  albuterol (PROVENTIL HFA;VENTOLIN HFA) 108 (90 Base) MCG/ACT inhaler Inhale 1-2 puffs into the lungs every 6 (six) hours as needed for wheezing or shortness of breath. 06/10/16   Olivia Canter Sam, PA-C  benzonatate (TESSALON) 100 MG capsule Take 1 capsule (100 mg total) by mouth 3 (three) times daily as needed for cough. 06/10/16   Olivia Canter Sam, PA-C  cetirizine-pseudoephedrine (ZYRTEC-D) 5-120 MG tablet Take 1 tablet by mouth daily. 06/10/16   Olivia Canter Sam, PA-C  doxycycline (VIBRAMYCIN) 100 MG capsule Take 1 capsule (100 mg total) by mouth 2 (two) times daily. 06/05/16   Antonietta Breach, PA-C  fluticasone (FLONASE) 50 MCG/ACT nasal spray Place 2 sprays into both nostrils daily. 06/10/16   Olivia Canter Sam, PA-C  hydrocortisone cream 1 % Apply to affected area 2 times daily 07/02/16   Kinnie Feil, PA-C  naproxen (NAPROSYN) 500 MG tablet Take 1 tablet (500 mg total) by mouth 2 (two) times daily. 06/05/16   Antonietta Breach, PA-C    Family History History reviewed. No pertinent family history.  Social History Social History  Substance Use Topics  . Smoking status: Never Smoker  . Smokeless tobacco: Never Used  . Alcohol use No     Allergies   Prednisone; Bactrim [sulfamethoxazole-trimethoprim]; Flagyl [metronidazole]; Morphine and related; Vicodin [hydrocodone-acetaminophen]; and Zofran [ondansetron hcl]   Review of Systems Review of Systems  Constitutional: Negative for chills and fever.  HENT:  Negative for rhinorrhea and sneezing.   Respiratory: Negative for cough and shortness of breath.   Cardiovascular: Negative for chest pain.  Gastrointestinal: Negative for abdominal pain, nausea and vomiting.  Skin: Positive for rash.     Physical Exam Updated Vital Signs BP 104/72 (BP Location: Left Arm)   Pulse 82   Temp 98.2 F (36.8 C) (Oral)   Resp 15   Ht 5\' 8"  (1.727 m)   Wt 214 lb (97.1 kg)   SpO2 96%   BMI 32.54 kg/m     Physical Exam  Constitutional: She is oriented to person, place, and time. She appears well-developed and well-nourished.  HENT:  Head: Normocephalic and atraumatic.  Mouth/Throat: Uvula is midline, oropharynx is clear and moist and mucous membranes are normal.  Nasal turbinates pink with no edema or drainage.  Eyes: Pupils are equal, round, and reactive to light.  Neck: Normal range of motion.  Cardiovascular: Normal rate, regular rhythm and normal heart sounds.  Exam reveals no gallop and no friction rub.   No murmur heard. Pulmonary/Chest: Effort normal and breath sounds normal. No respiratory distress. She has no wheezes. She has no rales.  Abdominal: Soft. She exhibits no distension. There is no tenderness.  Musculoskeletal: Normal range of motion.  Lymphadenopathy:    She has no cervical adenopathy.  Neurological: She is alert and oriented to person, place, and time.  Skin: Skin is warm and dry. Rash noted.  7 cm erythematous patch to posterior right upper arm with three additional satellite red lesions. Rash is blanchable. Nontender to palpation. No warmth compared to the rest of the arm.  Psychiatric: She has a normal mood and affect. Her behavior is normal.  Nursing note and vitals reviewed.    ED Treatments / Results  DIAGNOSTIC STUDIES: Oxygen Saturation is 96% on RA, adequate by my interpretation.   COORDINATION OF CARE: 11:32 AM- recommended pt to take OTC antihistamine and prescribe Hydrocortisone cream. Return precautions discussed. Pt verbalizes understanding and agrees to plan.  Medications - No data to display  Labs (all labs ordered are listed, but only abnormal results are displayed) Labs Reviewed - No data to display  EKG  EKG Interpretation None       Radiology No results found.  Procedures Procedures (including critical care time)  Medications Ordered in ED Medications - No data to display   Initial Impression / Assessment and Plan / ED  Course  I have reviewed the triage vital signs and the nursing notes.  Pertinent labs & imaging results that were available during my care of the patient were reviewed by me and considered in my medical decision making (see chart for details).  Clinical Course   40 yo female presents with 7 cm erythematous patch to posterior/medial R triceps.  Rash is blanchable, non tender without pruritus.  Pt denies fever, sores in mucous membranes, cough/sneezing, rash anywhere.  Ddx includes contact dermatitis and less likely shingles.  Pt discharged with hydrocortisone cream BID.  Per chart, pt allergic to prednisone, pt denies having issues with topical steroids, she was agreeable to try hydrocortisone cream today. Return precautions given.    Final Clinical Impressions(s) / ED Diagnoses   Final diagnoses:  Rash    New Prescriptions New Prescriptions   HYDROCORTISONE CREAM 1 %    Apply to affected area 2 times daily     Kinnie Feil, PA-C 07/02/16 1209    Noemi Chapel, MD 07/07/16 1250

## 2016-07-02 NOTE — ED Triage Notes (Signed)
Pt sts rash to right arm that is not itching or painful

## 2016-07-02 NOTE — ED Notes (Signed)
Patient given [facility] coupon.

## 2016-07-09 ENCOUNTER — Inpatient Hospital Stay: Payer: Self-pay

## 2016-08-12 ENCOUNTER — Encounter (HOSPITAL_COMMUNITY): Payer: Self-pay | Admitting: Emergency Medicine

## 2016-08-12 ENCOUNTER — Emergency Department (HOSPITAL_COMMUNITY)
Admission: EM | Admit: 2016-08-12 | Discharge: 2016-08-12 | Disposition: A | Discharge status: Home / Self Care | Payer: Self-pay | Attending: Emergency Medicine | Admitting: Emergency Medicine

## 2016-08-12 ENCOUNTER — Encounter (HOSPITAL_COMMUNITY): Payer: Self-pay | Admitting: *Deleted

## 2016-08-12 ENCOUNTER — Emergency Department (HOSPITAL_COMMUNITY)
Admission: EM | Admit: 2016-08-12 | Discharge: 2016-08-13 | Disposition: A | Discharge status: Home / Self Care | Payer: Self-pay | Attending: Emergency Medicine | Admitting: Emergency Medicine

## 2016-08-12 DIAGNOSIS — J45909 Unspecified asthma, uncomplicated: Secondary | ICD-10-CM | POA: Insufficient documentation

## 2016-08-12 DIAGNOSIS — A599 Trichomoniasis, unspecified: Secondary | ICD-10-CM | POA: Insufficient documentation

## 2016-08-12 DIAGNOSIS — Z79899 Other long term (current) drug therapy: Secondary | ICD-10-CM | POA: Insufficient documentation

## 2016-08-12 DIAGNOSIS — R102 Pelvic and perineal pain: Secondary | ICD-10-CM | POA: Insufficient documentation

## 2016-08-12 HISTORY — DX: Trichomoniasis, unspecified: A59.9

## 2016-08-12 LAB — URINALYSIS, ROUTINE W REFLEX MICROSCOPIC
BILIRUBIN URINE: NEGATIVE
Bacteria, UA: NONE SEEN
GLUCOSE, UA: NEGATIVE mg/dL
KETONES UR: NEGATIVE mg/dL
LEUKOCYTES UA: NEGATIVE
Nitrite: NEGATIVE
PH: 5 (ref 5.0–8.0)
PROTEIN: NEGATIVE mg/dL
Specific Gravity, Urine: 1.027 (ref 1.005–1.030)

## 2016-08-12 LAB — BASIC METABOLIC PANEL
ANION GAP: 10 (ref 5–15)
BUN: 14 mg/dL (ref 6–20)
CHLORIDE: 106 mmol/L (ref 101–111)
CO2: 22 mmol/L (ref 22–32)
Calcium: 9.6 mg/dL (ref 8.9–10.3)
Creatinine, Ser: 0.96 mg/dL (ref 0.44–1.00)
GFR calc Af Amer: >60 mL/min (ref >60)
GFR calc non Af Amer: >60 mL/min (ref >60)
GLUCOSE: 103 mg/dL — AB (ref 65–99)
POTASSIUM: 4 mmol/L (ref 3.5–5.1)
Sodium: 138 mmol/L (ref 135–145)

## 2016-08-12 LAB — WET PREP, GENITAL
Clue Cells Wet Prep HPF POC: NONE SEEN
Sperm: NONE SEEN
Yeast Wet Prep HPF POC: NONE SEEN

## 2016-08-12 LAB — POC URINE PREG, ED: PREG TEST UR: NEGATIVE

## 2016-08-12 LAB — CBC
HEMATOCRIT: 39.6 % (ref 36.0–46.0)
HEMOGLOBIN: 13.4 g/dL (ref 12.0–15.0)
MCH: 33.6 pg (ref 26.0–34.0)
MCHC: 33.8 g/dL (ref 30.0–36.0)
MCV: 99.2 fL (ref 78.0–100.0)
PLATELETS: 295 10*3/uL (ref 150–400)
RBC: 3.99 MIL/uL (ref 3.87–5.11)
RDW: 12.3 % (ref 11.5–15.5)
WBC: 8.7 10*3/uL (ref 4.0–10.5)

## 2016-08-12 MED ORDER — CEFTRIAXONE SODIUM 250 MG IJ SOLR
250.0000 mg | Freq: Once | INTRAMUSCULAR | Status: AC
  Administered 2016-08-12: 250 mg via INTRAMUSCULAR
  Filled 2016-08-12: qty 250

## 2016-08-12 MED ORDER — AZITHROMYCIN 250 MG PO TABS
1000.0000 mg | ORAL_TABLET | Freq: Once | ORAL | Status: AC
  Administered 2016-08-12: 1000 mg via ORAL
  Filled 2016-08-12: qty 4

## 2016-08-12 MED ORDER — STERILE WATER FOR INJECTION IJ SOLN
INTRAMUSCULAR | Status: AC
  Administered 2016-08-12: 10 mL
  Filled 2016-08-12: qty 10

## 2016-08-12 NOTE — ED Notes (Signed)
Declined W/C at D/C and was escorted to lobby by RN. 

## 2016-08-12 NOTE — ED Triage Notes (Signed)
Pt has had vag discharge for 1 week with yellow foul odor discharge. Pt was seen by [facility] eariler today, had a pelvic done, and tested positive for trich. Pt has an allergy to flagy; EDP recommended admission for desentitization to allergy for treatment but pt was unable to stay at that time. Pt returned for admission. Spoke to [employer] who gave verbal orders for basic lab work for admit

## 2016-08-12 NOTE — ED Provider Notes (Signed)
[facility] DEPT Provider Note   CSN: [id number] Arrival date & time: 08/12/16  1440  By signing my name below, I, Evelene Croon, attest that this documentation has been prepared under the direction and in the presence of Fatima Blank, MD . Electronically Signed: Evelene Croon, Scribe. 08/12/2016. 3:54 PM.  History   Chief Complaint Chief Complaint  Patient presents with  . Abdominal Pain  . Vaginal Discharge    The history is provided by the patient. No language interpreter was used.     HPI Comments:  Kelsey Maynard is a 40 y.o. female who presents to the Emergency Department complaining of stabbing, waxing and waning lower abdominal pain x 1 week. Her pain has worsened in the last 3 days. She reports associated vaginal discharge. Pt describes white and thick discharge as well as yellow and malodorous discharge; denies h/o same. She also notes diarrhea x a few days. No recent sick contacts. No fever in the last 2 days, body aches, CP, SOB, cough, congestion, recent dysuria. Pt is not currently sexually active; last encounter was ~ 3 months ago with a female. Pt was diagnosed with Syphilis in July 2017; tested negative for HIV at the time. She states she was treated with PCN but is not sure if she received the complete treatment. States she has not had any other STDs in the past.   Pt also notes rash to her bilateral inner thighs x 3 days. No alleviating factors noted.   Past Medical History:  Diagnosis Date  . Asthma     There are no active problems to display for this patient.   Past Surgical History:  Procedure Laterality Date  . CHOLECYSTECTOMY    . HERNIA REPAIR      OB History    No data available       Home Medications    Prior to Admission medications   Medication Sig Start Date End Date Taking? Authorizing Provider  albuterol (PROVENTIL HFA;VENTOLIN HFA) 108 (90 BASE) MCG/ACT inhaler Inhale 1-2 puffs into the lungs every 6 (six) hours as needed for  wheezing or shortness of breath.     Historical Provider, MD  albuterol (PROVENTIL HFA;VENTOLIN HFA) 108 (90 Base) MCG/ACT inhaler Inhale 1-2 puffs into the lungs every 6 (six) hours as needed for wheezing or shortness of breath. 06/10/16   Olivia Canter Sam, PA-C  benzonatate (TESSALON) 100 MG capsule Take 1 capsule (100 mg total) by mouth 3 (three) times daily as needed for cough. 06/10/16   Olivia Canter Sam, PA-C  cetirizine-pseudoephedrine (ZYRTEC-D) 5-120 MG tablet Take 1 tablet by mouth daily. 06/10/16   Olivia Canter Sam, PA-C  doxycycline (VIBRAMYCIN) 100 MG capsule Take 1 capsule (100 mg total) by mouth 2 (two) times daily. 06/05/16   Antonietta Breach, PA-C  fluticasone (FLONASE) 50 MCG/ACT nasal spray Place 2 sprays into both nostrils daily. 06/10/16   Olivia Canter Sam, PA-C  hydrocortisone cream 1 % Apply to affected area 2 times daily 07/02/16   Kinnie Feil, PA-C  naproxen (NAPROSYN) 500 MG tablet Take 1 tablet (500 mg total) by mouth 2 (two) times daily. 06/05/16   Antonietta Breach, PA-C    Family History History reviewed. No pertinent family history.  Social History Social History  Substance Use Topics  . Smoking status: Never Smoker  . Smokeless tobacco: Never Used  . Alcohol use No     Allergies   Prednisone; Bactrim [sulfamethoxazole-trimethoprim]; Flagyl [metronidazole]; Morphine and related; Vicodin [hydrocodone-acetaminophen]; and Zofran [ondansetron hcl]  Review of Systems Review of Systems 10 systems reviewed and all are negative for acute change except as noted in the HPI.  Physical Exam Updated Vital Signs BP 112/84 (BP Location: Right Arm)   Pulse 60   Temp 97.8 F (36.6 C) (Oral)   Resp 18   SpO2 100%   Physical Exam  Constitutional: She is oriented to person, place, and time. She appears well-developed and well-nourished. No distress.  HENT:  Head: Normocephalic and atraumatic.  Nose: Nose normal.  Eyes: Conjunctivae and EOM are normal. Pupils are equal, round, and  reactive to light. Right eye exhibits no discharge. Left eye exhibits no discharge. No scleral icterus.  Neck: Normal range of motion. Neck supple.  Cardiovascular: Normal rate, regular rhythm and normal heart sounds.  Exam reveals no gallop and no friction rub.   No murmur heard. Pulmonary/Chest: Effort normal and breath sounds normal. No stridor. No respiratory distress. She has no rales.  Abdominal: Soft. She exhibits no distension. There is no tenderness.  Genitourinary: Pelvic exam was performed with patient supine. Uterus is not tender. Cervix exhibits no motion tenderness, no discharge and no friability. Right adnexum displays no mass and no tenderness. Left adnexum displays no mass and no tenderness. No erythema, tenderness or bleeding in the vagina. No foreign body in the vagina. Vaginal discharge (mild whitish) found.  Genitourinary Comments: Chaperone was present for exam which was performed with no discomfort or complications.    Musculoskeletal: She exhibits no edema or tenderness.  Neurological: She is alert and oriented to person, place, and time.  Skin: Skin is warm and dry. No rash noted. She is not diaphoretic. No erythema.  Psychiatric: She has a normal mood and affect.  Nursing note and vitals reviewed.    ED Treatments / Results  DIAGNOSTIC STUDIES:  Oxygen Saturation is 100% on RA, normal by my interpretation.    COORDINATION OF CARE:  3:54 PM Discussed treatment plan with pt at bedside and pt agreed to plan.  Labs (all labs ordered are listed, but only abnormal results are displayed) Labs Reviewed  WET PREP, GENITAL - Abnormal; Notable for the following:       Result Value   Trich, Wet Prep PRESENT (*)    WBC, Wet Prep HPF POC MODERATE (*)    All other components within normal limits  URINALYSIS, ROUTINE W REFLEX MICROSCOPIC - Abnormal; Notable for the following:    APPearance HAZY (*)    Hgb urine dipstick SMALL (*)    Squamous Epithelial / LPF 6-30 (*)      All other components within normal limits  RPR  POC URINE PREG, ED  GC/CHLAMYDIA PROBE AMP ([place]) NOT AT [facility]    EKG  EKG Interpretation None       Radiology No results found.  Procedures Procedures (including critical care time)  Medications Ordered in ED Medications  cefTRIAXone (ROCEPHIN) injection 250 mg (250 mg Intramuscular Given 08/12/16 1808)  azithromycin (ZITHROMAX) tablet 1,000 mg (1,000 mg Oral Given 08/12/16 1807)  sterile water (preservative free) injection (10 mLs  Given 08/12/16 1808)     Initial Impression / Assessment and Plan / ED Course  I have reviewed the triage vital signs and the nursing notes.  Pertinent labs & imaging results that were available during my care of the patient were reviewed by me and considered in my medical decision making (see chart for details).  Clinical Course as of Aug 12 1845  Thu Aug 12, 2016  1630 Benign abd on exam. Low suspicion for appendicitis, diverticulitis, ovarian torsion, colitis, SBO. UA not consistent with UTI. UPT negative. Pelvic exam w/o signs of cervicitis or PID. Wet prep and GC/Chlam sent and currently pending. Will also send RPR given report of +syphilis.  [PC]    Clinical Course User Index [PC] Fatima Blank, MD    5:48 PM Pt updated with results. +Trichomonas but pt is allergic to Flagyl. Discussed need for admission for desensitization, but pt unable to stay at this time. States she will return later on tonight or tomorrow for treatment once she has set her affairs in order. Feel this is appropriate. Will treat possible superimposed GC/Chlam infection the ED with Rocephin,  Zithromax.   Monitored w/o reaction to meds given.  The patient is safe for discharge with strict return precautions.   Final Clinical Impressions(s) / ED Diagnoses   Final diagnoses:  Trichomonal infection  Pelvic pain in female   Disposition: Discharge  Condition: Good  I have discussed the results,  Dx and Tx plan with the patient who expressed understanding and agree(s) with the plan. Discharge instructions discussed at great length. The patient was given strict return precautions who verbalized understanding of the instructions. No further questions at time of discharge.    Current Discharge Medication List      Follow Up: [facility] [id number] [place] [phone] Go to  to be admitted for desentization to Flagyl for Trichomonal treatment given your allergy.   I personally performed the services described in this documentation, which was scribed in my presence. The recorded information has been reviewed and is accurate.        Fatima Blank, MD 08/12/16 [phone]

## 2016-08-12 NOTE — ED Notes (Signed)
Urine sample, RPR, and wet prep completed on previous ER visit. Pt was treated with Zithromax and Rocephin at that time

## 2016-08-12 NOTE — ED Notes (Signed)
Pt was seen by Dr. Leonette Monarch earlier this date. Pt was diagnosed with trich and patient is allergic to flagyl so Dr. Leonette Monarch advised pt he was going to have to admit her for medication administration. Pt advised Dr. Leonette Monarch that she has some stuff to take care of at home and would return.

## 2016-08-12 NOTE — ED Triage Notes (Signed)
Pt sts abd pain and vaginal discharge x 4 days with rash between legs that is painful

## 2016-08-12 NOTE — ED Triage Notes (Signed)
PT DC home with the plan to return for admission for anti-bx  Treatment. This plan was reviewed with PT by EDP [place].

## 2016-08-13 LAB — RPR, QUANT+TP ABS (REFLEX)
Rapid Plasma Reagin, Quant: 1:4 {titer} — ABNORMAL HIGH
T Pallidum Abs: POSITIVE — AB

## 2016-08-13 LAB — GC/CHLAMYDIA PROBE AMP (~~LOC~~) NOT AT ARMC
Chlamydia: NEGATIVE
NEISSERIA GONORRHEA: NEGATIVE

## 2016-08-13 LAB — RPR: RPR Ser Ql: REACTIVE — AB

## 2016-08-13 NOTE — ED Provider Notes (Signed)
[facility] DEPT Provider Note   CSN: [id number] Arrival date & time: 08/12/16  2100  By signing my name below, I, Higinio Plan, attest that this documentation has been prepared under the direction and in the presence of Orpah Greek, MD . Electronically Signed: Higinio Plan, Scribe. 08/13/2016. 1:15 AM.  History   Chief Complaint Chief Complaint  Patient presents with  . SEXUALLY TRANSMITTED DISEASE   The history is provided by the patient. No language interpreter was used.   HPI Comments: Kelsey Maynard is a 40 y.o. female with PMHx of asthma and trichomonas, who presents to the Emergency Department complaining of intermittent, varying amounts of vaginal discharge and yellow foul odor that began 1 week ago. Pt reports she visited the ED earlier today for the same and had a pelvic exam performed and was tested positive for trichomonas. She notes she has an allergy to flagyl; she states the last time she used it was over 20 years ago while she was pregnant. She believes she took her medication "on an empty stomach" and then experienced nausea, vomiting and "hives" to her extremities and face soon after and was told to "never take this medication again." She denies sensation of throat closing, difficulty swallowing or shortness of breath during this allergic reaction. Pt was recommended admission to the hospital for desensitization of this allergy for treatment earlier today but pt was unable to stay at that time. She states she has returned to the ED at this time to follow up for her admission.   Past Medical History:  Diagnosis Date  . Asthma   . Trichimoniasis    There are no active problems to display for this patient.  Past Surgical History:  Procedure Laterality Date  . CHOLECYSTECTOMY    . HERNIA REPAIR      OB History    No data available     Home Medications    Prior to Admission medications   Medication Sig Start Date End Date Taking? Authorizing Provider    albuterol (PROVENTIL HFA;VENTOLIN HFA) 108 (90 BASE) MCG/ACT inhaler Inhale 1-2 puffs into the lungs every 6 (six) hours as needed for wheezing or shortness of breath.     Historical Provider, MD  albuterol (PROVENTIL HFA;VENTOLIN HFA) 108 (90 Base) MCG/ACT inhaler Inhale 1-2 puffs into the lungs every 6 (six) hours as needed for wheezing or shortness of breath. 06/10/16   Olivia Canter Sam, PA-C  benzonatate (TESSALON) 100 MG capsule Take 1 capsule (100 mg total) by mouth 3 (three) times daily as needed for cough. 06/10/16   Olivia Canter Sam, PA-C  cetirizine-pseudoephedrine (ZYRTEC-D) 5-120 MG tablet Take 1 tablet by mouth daily. 06/10/16   Olivia Canter Sam, PA-C  doxycycline (VIBRAMYCIN) 100 MG capsule Take 1 capsule (100 mg total) by mouth 2 (two) times daily. 06/05/16   Antonietta Breach, PA-C  fluticasone (FLONASE) 50 MCG/ACT nasal spray Place 2 sprays into both nostrils daily. 06/10/16   Olivia Canter Sam, PA-C  hydrocortisone cream 1 % Apply to affected area 2 times daily 07/02/16   Kinnie Feil, PA-C  naproxen (NAPROSYN) 500 MG tablet Take 1 tablet (500 mg total) by mouth 2 (two) times daily. 06/05/16   Antonietta Breach, PA-C    Family History No family history on file.  Social History Social History  Substance Use Topics  . Smoking status: Never Smoker  . Smokeless tobacco: Never Used  . Alcohol use No     Allergies   Prednisone; Bactrim [sulfamethoxazole-trimethoprim]; Flagyl [metronidazole];  Morphine and related; Vicodin [hydrocodone-acetaminophen]; and Zofran [ondansetron hcl]   Review of Systems Review of Systems  Constitutional: Negative for fever.  Genitourinary: Positive for vaginal discharge.   Physical Exam Updated Vital Signs BP 113/69 (BP Location: Right Arm)   Pulse 75   Temp 98.3 F (36.8 C) (Oral)   Resp 18   Ht 5\' 8"  (1.727 m)   Wt 212 lb 1.6 oz (96.2 kg)   SpO2 99%   BMI 32.25 kg/m   Physical Exam  Constitutional: She is oriented to person, place, and time. She appears  well-developed and well-nourished. No distress.  HENT:  Head: Normocephalic and atraumatic.  Right Ear: Hearing normal.  Left Ear: Hearing normal.  Nose: Nose normal.  Mouth/Throat: Oropharynx is clear and moist and mucous membranes are normal.  Eyes: Conjunctivae and EOM are normal. Pupils are equal, round, and reactive to light.  Neck: Normal range of motion. Neck supple.  Cardiovascular: Regular rhythm, S1 normal and S2 normal.  Exam reveals no gallop and no friction rub.   No murmur heard. Pulmonary/Chest: Effort normal and breath sounds normal. No respiratory distress. She exhibits no tenderness.  Abdominal: Soft. Normal appearance and bowel sounds are normal. There is no hepatosplenomegaly. There is no tenderness. There is no rebound, no guarding, no tenderness at McBurney's point and negative Murphy's sign. No hernia.  Musculoskeletal: Normal range of motion.  Neurological: She is alert and oriented to person, place, and time. She has normal strength. No cranial nerve deficit or sensory deficit. Coordination normal. GCS eye subscore is 4. GCS verbal subscore is 5. GCS motor subscore is 6.  Skin: Skin is warm, dry and intact. No rash noted. No cyanosis.  Psychiatric: She has a normal mood and affect. Her speech is normal and behavior is normal. Thought content normal.  Nursing note and vitals reviewed.  ED Treatments / Results  Labs (all labs ordered are listed, but only abnormal results are displayed) Labs Reviewed  BASIC METABOLIC PANEL - Abnormal; Notable for the following:       Result Value   Glucose, Bld 103 (*)    All other components within normal limits  CBC    EKG  EKG Interpretation None       Radiology No results found.  Procedures Procedures (including critical care time)  Medications Ordered in ED Medications - No data to display  DIAGNOSTIC STUDIES:  Oxygen Saturation is 99% on RA, normal by my interpretation.    COORDINATION OF CARE:  1:02 AM  Discussed treatment plan with pt at bedside and pt agreed to plan.  Initial Impression / Assessment and Plan / ED Course  I have reviewed the triage vital signs and the nursing notes.  Pertinent labs & imaging results that were available during my care of the patient were reviewed by me and considered in my medical decision making (see chart for details).  Clinical Course    Patient returns for possible admission for treatment of trichomoniasis. Patient was seen earlier today and diagnosed with trichomoniasis, has an allergy to Flagyl. I discussed this with OB/GYN on-call. They report that there are no alternatives, and this is confirmed by checking with CDC recommendations. Also, desensitization is not performed by OB/GYN at [facility] hospital, would be done by medicine. Also discussed with pharmacy. Recommended discussing with infectious disease for possible inpatient desensitization.  I discussed this with Dr. Linus Salmons, on-call for infectious disease. He recommended outpatient desensitization by allergist for further treatment. This was discussed with the  patient. She is in agreement with this plan. She just started a new job does not want to miss any work by being admitted to the hospital. Her only symptom is discharge, will be able to manage outpatient treatment without anticipated difficulty.  Final Clinical Impressions(s) / ED Diagnoses   Final diagnoses:  Trichomoniasis    New Prescriptions Discharge Medication List as of 08/13/2016  1:50 AM     I personally performed the services described in this documentation, which was scribed in my presence. The recorded information has been reviewed and is accurate.     Orpah Greek, MD 08/13/16 [phone]

## 2016-08-16 ENCOUNTER — Encounter (HOSPITAL_COMMUNITY): Payer: Self-pay | Admitting: Emergency Medicine

## 2016-09-01 ENCOUNTER — Inpatient Hospital Stay (HOSPITAL_COMMUNITY)
Admission: AD | Admit: 2016-09-01 | Discharge: 2016-09-01 | Disposition: A | Discharge status: Home / Self Care | Payer: Self-pay | Source: Ambulatory Visit | Attending: Family Medicine | Admitting: Family Medicine

## 2016-09-01 ENCOUNTER — Inpatient Hospital Stay (HOSPITAL_COMMUNITY): Payer: Self-pay

## 2016-09-01 ENCOUNTER — Encounter (HOSPITAL_COMMUNITY): Payer: Self-pay | Admitting: *Deleted

## 2016-09-01 DIAGNOSIS — D259 Leiomyoma of uterus, unspecified: Secondary | ICD-10-CM | POA: Insufficient documentation

## 2016-09-01 DIAGNOSIS — Z883 Allergy status to other anti-infective agents status: Secondary | ICD-10-CM | POA: Insufficient documentation

## 2016-09-01 DIAGNOSIS — J45909 Unspecified asthma, uncomplicated: Secondary | ICD-10-CM | POA: Insufficient documentation

## 2016-09-01 DIAGNOSIS — Z888 Allergy status to other drugs, medicaments and biological substances status: Secondary | ICD-10-CM | POA: Insufficient documentation

## 2016-09-01 DIAGNOSIS — Z79899 Other long term (current) drug therapy: Secondary | ICD-10-CM | POA: Insufficient documentation

## 2016-09-01 DIAGNOSIS — Z882 Allergy status to sulfonamides status: Secondary | ICD-10-CM | POA: Insufficient documentation

## 2016-09-01 DIAGNOSIS — R102 Pelvic and perineal pain: Secondary | ICD-10-CM | POA: Insufficient documentation

## 2016-09-01 DIAGNOSIS — Z885 Allergy status to narcotic agent status: Secondary | ICD-10-CM | POA: Insufficient documentation

## 2016-09-01 DIAGNOSIS — Z7951 Long term (current) use of inhaled steroids: Secondary | ICD-10-CM | POA: Insufficient documentation

## 2016-09-01 DIAGNOSIS — A084 Viral intestinal infection, unspecified: Secondary | ICD-10-CM | POA: Insufficient documentation

## 2016-09-01 LAB — URINALYSIS, ROUTINE W REFLEX MICROSCOPIC
BILIRUBIN URINE: NEGATIVE
Bacteria, UA: NONE SEEN
GLUCOSE, UA: NEGATIVE mg/dL
Ketones, ur: NEGATIVE mg/dL
Leukocytes, UA: NEGATIVE
Nitrite: NEGATIVE
PROTEIN: NEGATIVE mg/dL
Specific Gravity, Urine: 1.025 (ref 1.005–1.030)
pH: 5 (ref 5.0–8.0)

## 2016-09-01 LAB — CBC
HEMATOCRIT: 35.1 % — AB (ref 36.0–46.0)
HEMOGLOBIN: 12.2 g/dL (ref 12.0–15.0)
MCH: 34.8 pg — ABNORMAL HIGH (ref 26.0–34.0)
MCHC: 34.8 g/dL (ref 30.0–36.0)
MCV: 100 fL (ref 78.0–100.0)
Platelets: 260 10*3/uL (ref 150–400)
RBC: 3.51 MIL/uL — AB (ref 3.87–5.11)
RDW: 12.9 % (ref 11.5–15.5)
WBC: 6.9 10*3/uL (ref 4.0–10.5)

## 2016-09-01 LAB — POCT PREGNANCY, URINE: Preg Test, Ur: NEGATIVE

## 2016-09-01 MED ORDER — TINIDAZOLE 500 MG PO TABS
2.0000 g | ORAL_TABLET | Freq: Once | ORAL | 0 refills | Status: AC
Start: 2016-09-01 — End: 2016-09-01

## 2016-09-01 MED ORDER — IBUPROFEN 600 MG PO TABS: 600.0000 mg | ORAL_TABLET | Freq: Four times a day (QID) | ORAL | 1 refills | Status: DC | PRN

## 2016-09-01 MED ORDER — PROMETHAZINE HCL 25 MG PO TABS
25.0000 mg | ORAL_TABLET | Freq: Once | ORAL | Status: AC
  Administered 2016-09-01: 25 mg via ORAL
  Filled 2016-09-01: qty 1

## 2016-09-01 NOTE — Discharge Instructions (Signed)
Trichomoniasis °Trichomoniasis is an infection caused by an organism called Trichomonas. The infection can affect both women and men. In women, the outer female genitalia and the vagina are affected. In men, the penis is mainly affected, but the prostate and other reproductive organs can also be involved. Trichomoniasis is a sexually transmitted infection (STI) and is most often passed to another person through sexual contact.  °RISK FACTORS °Having unprotected sexual intercourse. °Having sexual intercourse with an infected partner. °SIGNS AND SYMPTOMS  °Symptoms of trichomoniasis in women include: °Abnormal gray-green frothy vaginal discharge. °Itching and irritation of the vagina. °Itching and irritation of the area outside the vagina. °Symptoms of trichomoniasis in men include:  °Penile discharge with or without pain. °Pain during urination. This results from inflammation of the urethra. °DIAGNOSIS  °Trichomoniasis may be found during a Pap test or physical exam. Your health care provider may use one of the following methods to help diagnose this infection: °Testing the pH of the vagina with a test tape. °Using a vaginal swab test that checks for the Trichomonas organism. A test is available that provides results within a few minutes. °Examining a urine sample. °Testing vaginal secretions. °Your health care provider may test you for other STIs, including HIV. °TREATMENT  °You may be given medicine to fight the infection. Women should inform their health care provider if they could be or are pregnant. Some medicines used to treat the infection should not be taken during pregnancy. °Your health care provider may recommend over-the-counter medicines or creams to decrease itching or irritation. °Your sexual partner will need to be treated if infected. °Your health care provider may test you for infection again 3 months after treatment. °HOME CARE INSTRUCTIONS  °Take medicines only as directed by your health care  provider. °Take over-the-counter medicine for itching or irritation as directed by your health care provider. °Do not have sexual intercourse while you have the infection. °Women should not douche or wear tampons while they have the infection. °Discuss your infection with your partner. Your partner may have gotten the infection from you, or you may have gotten it from your partner. °Have your sex partner get examined and treated if necessary. °Practice safe, informed, and protected sex. °See your health care provider for other STI testing. °SEEK MEDICAL CARE IF:  °You still have symptoms after you finish your medicine. °You develop abdominal pain. °You have pain when you urinate. °You have bleeding after sexual intercourse. °You develop a rash. °Your medicine makes you sick or makes you throw up (vomit). °MAKE SURE YOU: °Understand these instructions. °Will watch your condition. °Will get help right away if you are not doing well or get worse. °This information is not intended to replace advice given to you by your health care provider. Make sure you discuss any questions you have with your health care provider. °Document Released: 02/09/2001 Document Revised: 09/06/2014 Document Reviewed: 05/28/2013 °Elsevier Interactive Patient Education © 2017 [employer] °  °

## 2016-09-01 NOTE — MAU Note (Signed)
Having really bad cramps.  Was dx with trichomonas and syphyllis in Dec, is allergic to one of the meds, was told would just have to deal with some of the pain.  Fever started last night, was 102, this morning was 100.  Denies sore throat or cough. Has been having diarrhea since she was given the shot.

## 2016-09-01 NOTE — MAU Provider Note (Signed)
Chief Complaint: Abdominal Cramping; Fever; and Diarrhea   First Provider Initiated Contact with Patient 09/01/16 1626      SUBJECTIVE HPI: Kelsey Maynard is a 41 y.o. [id number] who presents to maternity admissions reporting fever and abdominal pain x 2 days and diarrhea off and on x 2.5 weeks. She reports continued yellow vaginal discharge with itching since prior to her ED visit on 12/14.  She reports fever of 102.0 F at home 3 days ago and 101.0 F at home today prior to coming to MAU. She did take ibuprofen 600 mg PO prior to coming to MAU.  She reports the diarrhea has been since her injection of antibiotics on 08/12/16 during and [facility] visit for abdominal pain but her ED note on the same date lists diarrhea as a complaint prior to administration of the medicine. She has not tried any treatments and the fever and diarrhea are both intermittent since onset.  Her abdominal pain is reported as starting 2-3 days ago but she did report abdominal pain on 12/14 as well.  She was diagnosed with trichomonas on 12/14 but not treated related to an allergy to Flagyl.  She received Rocephin 250 mg IM and azithromycin 1000 mg PO on 08/12/16.  It was recommended that she be admitted for desensitization to Flagyl but when pt returned for admission she was directed to do outpatient desensitization with an allergist. The pt does not have insurance and did not follow up outpatient.  Her RPR was positive with tPal positive and titer 1:4 on 12/14 also. Per the pt, she was diagnosed with primary syphilis in 2016 and received one PCN injection but did not return for a second scheduled injection. She denies vaginal bleeding, vaginal itching/burning, urinary symptoms, h/a, dizziness, n/v, or fever/chills.     HPI  Past Medical History:  Diagnosis Date  . Asthma   . Trichimoniasis    Past Surgical History:  Procedure Laterality Date  . CHOLECYSTECTOMY    . HERNIA REPAIR     Social History   Social History  . Marital  status: Divorced    Spouse name: N/A  . Number of children: N/A  . Years of education: N/A   Occupational History  . Not on file.   Social History Main Topics  . Smoking status: Never Smoker  . Smokeless tobacco: Never Used  . Alcohol use No  . Drug use: No  . Sexual activity: No   Other Topics Concern  . Not on file   Social History Narrative  . No narrative on file   No current facility-administered medications on file prior to encounter.    Current Outpatient Prescriptions on File Prior to Encounter  Medication Sig Dispense Refill  . albuterol (PROVENTIL HFA;VENTOLIN HFA) 108 (90 Base) MCG/ACT inhaler Inhale 1-2 puffs into the lungs every 6 (six) hours as needed for wheezing or shortness of breath. 1 Inhaler 0  . cetirizine-pseudoephedrine (ZYRTEC-D) 5-120 MG tablet Take 1 tablet by mouth daily. 14 tablet 0  . fluticasone (FLONASE) 50 MCG/ACT nasal spray Place 2 sprays into both nostrils daily. 16 g 0  . benzonatate (TESSALON) 100 MG capsule Take 1 capsule (100 mg total) by mouth 3 (three) times daily as needed for cough. (Patient not taking: Reported on 09/01/2016) 21 capsule 0  . doxycycline (VIBRAMYCIN) 100 MG capsule Take 1 capsule (100 mg total) by mouth 2 (two) times daily. (Patient not taking: Reported on 09/01/2016) 14 capsule 0  . hydrocortisone cream 1 % Apply to affected  area 2 times daily (Patient not taking: Reported on 09/01/2016) 15 g 0  . naproxen (NAPROSYN) 500 MG tablet Take 1 tablet (500 mg total) by mouth 2 (two) times daily. (Patient not taking: Reported on 09/01/2016) 30 tablet 0   Allergies  Allergen Reactions  . Prednisone Anaphylaxis, Hives and Nausea And Vomiting  . Bactrim [Sulfamethoxazole-Trimethoprim] Swelling and Other (See Comments)    Reaction:  Facial swelling  . Flagyl [Metronidazole] Hives and Itching  . Morphine And Related Hives and Itching  . Vicodin [Hydrocodone-Acetaminophen] Hives and Itching  . Zofran [Ondansetron Hcl] Swelling and Other  (See Comments)    Reaction:  Facial swelling    ROS:  Review of Systems  Constitutional: Negative for chills, fatigue and fever.  Respiratory: Negative for shortness of breath.   Cardiovascular: Negative for chest pain.  Gastrointestinal: Positive for diarrhea and nausea. Negative for vomiting.  Genitourinary: Positive for pelvic pain. Negative for difficulty urinating, dysuria, flank pain, vaginal bleeding, vaginal discharge and vaginal pain.  Neurological: Negative for dizziness and headaches.  Psychiatric/Behavioral: Negative.      I have reviewed patient's Past Medical Hx, Surgical Hx, Family Hx, Social Hx, medications and allergies.   Physical Exam   Patient Vitals for the past 24 hrs:  BP Temp Temp src Pulse Resp SpO2 Weight  09/01/16 1527 116/81 98.7 F (37.1 C) Oral 78 18 100 % 216 lb 3.2 oz (98.1 kg)  09/01/16 1525 - - - - - 100 % -   Constitutional: Well-developed, well-nourished female in no acute distress.  Cardiovascular: normal rate Respiratory: normal effort GI: Abd soft, non-tender. Pos BS x 4 MS: Extremities nontender, no edema, normal ROM Neurologic: Alert and oriented x 4.  GU: Neg CVAT.  PELVIC EXAM: Deferred--done on 12/14 at [facility] with cultures collected  LAB RESULTS Results for orders placed or performed during the hospital encounter of 09/01/16 (from the past 24 hour(s))  Urinalysis, Routine w reflex microscopic     Status: Abnormal   Collection Time: 09/01/16  3:30 PM  Result Value Ref Range   Color, Urine YELLOW YELLOW   APPearance CLEAR CLEAR   Specific Gravity, Urine 1.025 1.005 - 1.030   pH 5.0 5.0 - 8.0   Glucose, UA NEGATIVE NEGATIVE mg/dL   Hgb urine dipstick SMALL (A) NEGATIVE   Bilirubin Urine NEGATIVE NEGATIVE   Ketones, ur NEGATIVE NEGATIVE mg/dL   Protein, ur NEGATIVE NEGATIVE mg/dL   Nitrite NEGATIVE NEGATIVE   Leukocytes, UA NEGATIVE NEGATIVE   RBC / HPF 0-5 0 - 5 RBC/hpf   WBC, UA 0-5 0 - 5 WBC/hpf   Bacteria, UA NONE SEEN  NONE SEEN   Squamous Epithelial / LPF 0-5 (A) NONE SEEN   Mucous PRESENT   Pregnancy, urine POC     Status: None   Collection Time: 09/01/16  3:37 PM  Result Value Ref Range   Preg Test, Ur NEGATIVE NEGATIVE  CBC     Status: Abnormal   Collection Time: 09/01/16  6:16 PM  Result Value Ref Range   WBC 6.9 4.0 - 10.5 K/uL   RBC 3.51 (L) 3.87 - 5.11 MIL/uL   Hemoglobin 12.2 12.0 - 15.0 g/dL   HCT 35.1 (L) 36.0 - 46.0 %   MCV 100.0 78.0 - 100.0 fL   MCH 34.8 (H) 26.0 - 34.0 pg   MCHC 34.8 30.0 - 36.0 g/dL   RDW 12.9 11.5 - 15.5 %   Platelets 260 150 - 400 K/uL  IMAGING US Transvaginal Non-ob  Result Date: 09/01/2016 CLINICAL DATA:  Acute pelvic pain, fever.  Positive STD EXAM: TRANSABDOMINAL AND TRANSVAGINAL ULTRASOUND OF PELVIS TECHNIQUE: Both transabdominal and transvaginal ultrasound examinations of the pelvis were performed. Transabdominal technique was performed for global imaging of the pelvis including uterus, ovaries, adnexal regions, and pelvic cul-de-sac. It was necessary to proceed with endovaginal exam following the transabdominal exam to visualize the uterus and ovaries no. COMPARISON:  08/26/2015 CT FINDINGS: Uterus Measurements: 9.6 x 4.8 x 5.8 cm. Uterus is anteverted. There is a slightly submucosal left-sided uterine body fibroid measuring 1.4 x 1 x 1 cm. There is a 0.9 x 0.8 x 0.9 cm intramural anterior fundal fibroid. There is an intramural right-sided 0.9 x 0.8 x 0.8 cm fibroid. There is an intramural right-sided 1.6 x 1.1 x 1.1 cm fundal fibroid. Endometrium Thickness: 4.3 mm.  No focal abnormality visualized. Right ovary Measurements: 2.9 x 1.6 x 1.4 cm. Normal appearance/no adnexal mass. Left ovary Measurements: 2.6 x 1.6 x 1.8 cm. Normal appearance/no adnexal mass. Other findings No abnormal free fluid. IMPRESSION: Anteverted uterus containing at least 4 leiomyomas, one slightly submucosal on the left within the uterine body measuring 1.4 x 1 x 1 cm. Normal-appearing  ovaries bilaterally. Electronically Signed   By: Ashley Royalty M.D.   On: 09/01/2016 19:16   US Pelvis Complete  Result Date: 09/01/2016 CLINICAL DATA:  Acute pelvic pain, fever.  Positive STD EXAM: TRANSABDOMINAL AND TRANSVAGINAL ULTRASOUND OF PELVIS TECHNIQUE: Both transabdominal and transvaginal ultrasound examinations of the pelvis were performed. Transabdominal technique was performed for global imaging of the pelvis including uterus, ovaries, adnexal regions, and pelvic cul-de-sac. It was necessary to proceed with endovaginal exam following the transabdominal exam to visualize the uterus and ovaries no. COMPARISON:  08/26/2015 CT FINDINGS: Uterus Measurements: 9.6 x 4.8 x 5.8 cm. Uterus is anteverted. There is a slightly submucosal left-sided uterine body fibroid measuring 1.4 x 1 x 1 cm. There is a 0.9 x 0.8 x 0.9 cm intramural anterior fundal fibroid. There is an intramural right-sided 0.9 x 0.8 x 0.8 cm fibroid. There is an intramural right-sided 1.6 x 1.1 x 1.1 cm fundal fibroid. Endometrium Thickness: 4.3 mm.  No focal abnormality visualized. Right ovary Measurements: 2.9 x 1.6 x 1.4 cm. Normal appearance/no adnexal mass. Left ovary Measurements: 2.6 x 1.6 x 1.8 cm. Normal appearance/no adnexal mass. Other findings No abnormal free fluid. IMPRESSION: Anteverted uterus containing at least 4 leiomyomas, one slightly submucosal on the left within the uterine body measuring 1.4 x 1 x 1 cm. Normal-appearing ovaries bilaterally. Electronically Signed   By: Ashley Royalty M.D.   On: 09/01/2016 19:16    MAU Management/MDM: Ordered labs and imaging and reviewed results. Reviewed record of pt visits to [facility] at those visits including wet prep and RPR with confirmation and titer. Consult Dr Elonda Husky with assessment, lab results and pt history/treatments. Plan to add C-diff test, CBC, and pelvic US to today's evaluation. Results reviewed.  No evidence of systemic infection.  Pt has uterine fibroids likely  the source of intermittent recurrent pelvic pain and irregular vaginal bleeding.  Risks of treatment with Flagyl for trichomonas outweigh benefit.  Will provide Rx for alternative treatment with Tindamax 2 g PO x 1 dose.  Pt to watch for allergic reaction and seek medical care with any signs.  Rx for Ibuprofen 600 mg Q 6 hours PRN. Treatments in MAU included Phenergan 25 mg PO x 1.  Pt stable at  time of discharge.  ASSESSMENT 1. Uterine leiomyoma, unspecified location   2. Acute pelvic pain, female   3. Pelvic pain in female   4. Viral gastroenteritis     PLAN Discharge home  Allergies as of 09/01/2016      Reactions   Prednisone Anaphylaxis, Hives, Nausea And Vomiting   Bactrim [sulfamethoxazole-trimethoprim] Swelling, Other (See Comments)   Reaction:  Facial swelling   Flagyl [metronidazole] Hives, Itching   Morphine And Related Hives, Itching   Vicodin [hydrocodone-acetaminophen] Hives, Itching   Zofran [ondansetron Hcl] Swelling, Other (See Comments)   Reaction:  Facial swelling      Medication List    STOP taking these medications   ALEVE 220 MG tablet Generic drug:  naproxen sodium   benzonatate 100 MG capsule Commonly known as:  TESSALON   doxycycline 100 MG capsule Commonly known as:  VIBRAMYCIN   hydrocortisone cream 1 %   naproxen 500 MG tablet Commonly known as:  NAPROSYN   penicillin v potassium 500 MG tablet Commonly known as:  VEETID     TAKE these medications   albuterol 108 (90 Base) MCG/ACT inhaler Commonly known as:  PROVENTIL HFA;VENTOLIN HFA Inhale 1-2 puffs into the lungs every 6 (six) hours as needed for wheezing or shortness of breath.   cetirizine-pseudoephedrine 5-120 MG tablet Commonly known as:  ZYRTEC-D Take 1 tablet by mouth daily.   fluticasone 50 MCG/ACT nasal spray Commonly known as:  FLONASE Place 2 sprays into both nostrils daily.   ibuprofen 600 MG tablet Commonly known as:  ADVIL,MOTRIN Take 1 tablet (600 mg total) by mouth  every 6 (six) hours as needed.   tinidazole 500 MG tablet Commonly known as:  TINDAMAX Take 4 tablets (2,000 mg total) by mouth once.      Follow-up [facility] up.   Specialty:  Obstetrics and Gynecology Why:  The office will call you with an appointment. Return to MAU as needed for emergencies. Contact information: [place] Certified Nurse-Midwife 09/01/2016  8:44 PM

## 2016-11-04 ENCOUNTER — Inpatient Hospital Stay (HOSPITAL_COMMUNITY): Payer: Medicaid Other

## 2016-11-04 ENCOUNTER — Inpatient Hospital Stay (HOSPITAL_COMMUNITY)
Admission: AD | Admit: 2016-11-04 | Discharge: 2016-11-04 | Disposition: A | Discharge status: Home / Self Care | Payer: Medicaid Other | Source: Ambulatory Visit | Attending: Obstetrics and Gynecology | Admitting: Obstetrics and Gynecology

## 2016-11-04 ENCOUNTER — Encounter (HOSPITAL_COMMUNITY): Payer: Self-pay | Admitting: *Deleted

## 2016-11-04 DIAGNOSIS — J45909 Unspecified asthma, uncomplicated: Secondary | ICD-10-CM | POA: Insufficient documentation

## 2016-11-04 DIAGNOSIS — Z8619 Personal history of other infectious and parasitic diseases: Secondary | ICD-10-CM | POA: Insufficient documentation

## 2016-11-04 DIAGNOSIS — R1030 Lower abdominal pain, unspecified: Secondary | ICD-10-CM

## 2016-11-04 DIAGNOSIS — Z883 Allergy status to other anti-infective agents status: Secondary | ICD-10-CM | POA: Diagnosis not present

## 2016-11-04 DIAGNOSIS — Z885 Allergy status to narcotic agent status: Secondary | ICD-10-CM | POA: Insufficient documentation

## 2016-11-04 DIAGNOSIS — Z882 Allergy status to sulfonamides status: Secondary | ICD-10-CM | POA: Diagnosis not present

## 2016-11-04 DIAGNOSIS — N898 Other specified noninflammatory disorders of vagina: Secondary | ICD-10-CM | POA: Diagnosis not present

## 2016-11-04 DIAGNOSIS — D259 Leiomyoma of uterus, unspecified: Secondary | ICD-10-CM | POA: Diagnosis not present

## 2016-11-04 DIAGNOSIS — Z888 Allergy status to other drugs, medicaments and biological substances status: Secondary | ICD-10-CM | POA: Diagnosis not present

## 2016-11-04 DIAGNOSIS — R1031 Right lower quadrant pain: Secondary | ICD-10-CM | POA: Insufficient documentation

## 2016-11-04 LAB — COMPREHENSIVE METABOLIC PANEL
ALT: 13 U/L — AB (ref 14–54)
AST: 18 U/L (ref 15–41)
Albumin: 4 g/dL (ref 3.5–5.0)
Alkaline Phosphatase: 61 U/L (ref 38–126)
Anion gap: 6 (ref 5–15)
BUN: 16 mg/dL (ref 6–20)
CHLORIDE: 106 mmol/L (ref 101–111)
CO2: 24 mmol/L (ref 22–32)
CREATININE: 0.96 mg/dL (ref 0.44–1.00)
Calcium: 8.8 mg/dL — ABNORMAL LOW (ref 8.9–10.3)
Glucose, Bld: 82 mg/dL (ref 65–99)
POTASSIUM: 4.1 mmol/L (ref 3.5–5.1)
Sodium: 136 mmol/L (ref 135–145)
Total Bilirubin: 0.7 mg/dL (ref 0.3–1.2)
Total Protein: 7.4 g/dL (ref 6.5–8.1)

## 2016-11-04 LAB — CBC
HCT: 40.3 % (ref 36.0–46.0)
Hemoglobin: 13.3 g/dL (ref 12.0–15.0)
MCH: 34.3 pg — AB (ref 26.0–34.0)
MCHC: 33 g/dL (ref 30.0–36.0)
MCV: 103.9 fL — AB (ref 78.0–100.0)
Platelets: 251 10*3/uL (ref 150–400)
RBC: 3.88 MIL/uL (ref 3.87–5.11)
RDW: 12.7 % (ref 11.5–15.5)
WBC: 9.3 10*3/uL (ref 4.0–10.5)

## 2016-11-04 LAB — URINALYSIS, ROUTINE W REFLEX MICROSCOPIC
BILIRUBIN URINE: NEGATIVE
GLUCOSE, UA: NEGATIVE mg/dL
KETONES UR: 5 mg/dL — AB
LEUKOCYTES UA: NEGATIVE
Nitrite: NEGATIVE
PH: 5 (ref 5.0–8.0)
Protein, ur: NEGATIVE mg/dL
Specific Gravity, Urine: 1.023 (ref 1.005–1.030)

## 2016-11-04 LAB — WET PREP, GENITAL
Clue Cells Wet Prep HPF POC: NONE SEEN
SPERM: NONE SEEN
TRICH WET PREP: NONE SEEN
WBC, Wet Prep HPF POC: NONE SEEN
YEAST WET PREP: NONE SEEN

## 2016-11-04 LAB — POCT PREGNANCY, URINE: Preg Test, Ur: NEGATIVE

## 2016-11-04 MED ORDER — KETOROLAC TROMETHAMINE 30 MG/ML IJ SOLN
30.0000 mg | Freq: Once | INTRAMUSCULAR | Status: AC
  Administered 2016-11-04: 30 mg via INTRAMUSCULAR
  Filled 2016-11-04: qty 1

## 2016-11-04 MED ORDER — POVIDONE-IODINE 0.3 % VA SOLN: 1.0000 | Freq: Every day | VAGINAL | 0 refills | Status: DC

## 2016-11-04 MED ORDER — NAPROXEN 500 MG PO TABS: 500.0000 mg | ORAL_TABLET | Freq: Two times a day (BID) | ORAL | 0 refills | Status: DC

## 2016-11-04 NOTE — Discharge Instructions (Signed)
Syphilis Syphilis is an infectious disease. It can cause serious complications if left untreated. What are the causes? Syphilis is caused by a type of bacteria called Treponema pallidum. It is most commonly spread through sexual contact. Syphilis may also spread to a fetus through the blood of the mother. What are the signs or symptoms? Symptoms vary depending on the stage of the disease. Some symptoms may disappear without treatment. However, this does not mean that the infection is gone. One form of syphilis (called latent syphilis) has no symptoms. Primary Syphilis   Painless sores (chancres) in and around the genital organs and mouth.  Swollen lymph nodes near the sores. Secondary Syphilis   A rash or sores over any portion of the body, including the palms of the hands and soles of the feet.  Fever.  Headache.  Sore throat.  Swollen lymph nodes.  New sores in the mouth or on the genitals.  Feeling generally ill.  Having pain in the joints. Tertiary Syphilis  The third stage of syphilis involves severe damage to different organs in the body, such as the brain, spinal cord, and heart. Signs and symptoms may include:  Dementia.  Personality and mood changes.  Difficulty walking.  Heart failure.  Fainting.  Enlargement (aneurysm) of the aorta.  Tumors of the skin, bones, or liver.  Muscle weakness.  Sudden lightning pains, numbness, or tingling.  Problems with coordination.  Vision changes. How is this diagnosed?  A physical exam will be done.  Blood tests will be done to confirm the diagnosis.  If the disease is in the first or second stages, a fluid (drainage) sample from a sore or rash may be examined under a microscope to detect the disease-causing bacteria.  Fluid around the spine may need to be examined to detect brain damage or inflammation of the brain lining (meningitis).  If the disease is in the third stage, X-rays, CT scans, MRIs,  echocardiograms, ultrasounds, or cardiac catheterization may also be done to detect disease of the heart, aorta, or brain. How is this treated? Syphilis can be cured with antibiotic medicine if a diagnosis is made early. During the first day of treatment, you may experience fever, chills, headache, nausea, or aching all over your body. This is a normal reaction to the antibiotics. Follow these instructions at home:  Take your antibiotic medicine as directed by your health care provider. Finish the antibiotic even if you start to feel better. Incomplete treatment will put you at risk for continued infection and could be life threatening.  Take medicines only as directed by your health care provider.  Do not have sexual intercourse until your treatment is completed or as directed by your health care provider.  Inform your recent sexual partners that you were diagnosed with syphilis. They need to seek care and treatment, even if they have no symptoms. It is necessary that all your sexual partners be tested for infection and treated if they have the disease.  Keep all follow-up visits as directed by your health care provider. It is important to keep all your appointments.  If your test results are not ready during your visit, make an appointment with your health care provider to find out the results. Do not assume everything is normal if you have not heard from your health care provider or the medical facility. It is your responsibility to get your test results. Contact a health care provider if:  You continue to have any of the following 24 hours  after beginning treatment:  Fever.  Chills.  Headache.  Nausea.  Aching all over your body.  You have symptoms of an allergic reaction to medicine, such as:  Chills.  A headache.  Light-headedness.  A new rash (especially hives).  Difficulty breathing. This information is not intended to replace advice given to you by your health care  provider. Make sure you discuss any questions you have with your health care provider. Document Released: 06/06/2013 Document Revised: 01/22/2016 Document Reviewed: 03/06/2013 Elsevier Interactive Patient Education  2017 [employer] Uterine Fibroids Uterine fibroids are tissue masses (tumors). They are also called leiomyomas. They can develop inside of a womans womb (uterus). They can grow very large. Fibroids are not cancerous (benign). Most fibroids do not require medical treatment. Follow these instructions at home:  Keep all follow-up visits as told by your doctor. This is important.  Take medicines only as told by your doctor.  If you were prescribed a hormone treatment, take the hormone medicines exactly as told.  Do not take aspirin. It can cause bleeding.  Ask your doctor about taking iron pills and increasing the amount of dark green, leafy vegetables in your diet. These actions can help to boost your blood iron levels.  Pay close attention to your period. Tell your doctor about any changes, such as:  Increased blood flow. This may require you to use more pads or tampons than usual per month.  A change in the number of days that your period lasts per month.  A change in symptoms that come with your period, such as back pain or cramping in your belly area (abdomen). Contact a doctor if:  You have pain in your back or the area between your hip bones (pelvic area) that is not controlled by medicines.  You have pain in your abdomen that is not controlled with medicines.  You have an increase in bleeding between and during periods.  You soak tampons or pads in a half hour or less.  You feel lightheaded.  You feel extra tired.  You feel weak. Get help right away if:  You pass out (faint).  You have a sudden increase in pelvic pain. This information is not intended to replace advice given to you by your health care provider. Make sure you discuss any questions you  have with your health care provider. Document Released: 09/18/2010 Document Revised: 04/16/2016 Document Reviewed: 02/12/2014 Elsevier Interactive Patient Education  2017 [employer].

## 2016-11-04 NOTE — MAU Note (Signed)
Pt C/O R mid side pain "feels like a dead body laying on it" for 3 days, pain became worse this morning.  Was dx'd with trichomonas in MAU recently, is allergic to flagyl - was given tindamax but was told by pharmacist is contained flagyl & she didn't take it.  Denies bleeding.  Started having diarrhea today.

## 2016-11-04 NOTE — MAU Note (Signed)
C/o R flank pain for past 3 days; diarrheatoday;

## 2016-11-04 NOTE — MAU Provider Note (Signed)
History     CSN: [id number]  Arrival date and time: 11/04/16 1214   First Provider Initiated Contact with Patient 11/04/16 1308      Chief Complaint  Patient presents with  . Abdominal Pain  . Diarrhea   Non-pregnant female here with RLQ pain. She reports pain started 3 days ago. No fever or chills. No nausea or vomiting. Describes pain as intermittent and rates 8/10. She has not used anything for the pain. She also reports hx of trich that was diagnosed in December but she has not taken the Tinidazole because she has hx of Flagyl allergy (hives and oral edema) and was worried she may have same rxn. She hasn't had sexual relations since. She also had a dx of Syphilis in July 2017 (in [place]) and reports receiving a shot but did not have follow up. RPR 3 mos ago showed titre of 1:4. She also endorses thin white malodorous vaginal discharge x3 mos.   Pertinent Gynecological History: Menses: 10/10/16 Contraception: abstinence Sexually transmitted diseases: recent diagnosis: trich and past history: syphilis  Past Medical History:  Diagnosis Date  . Asthma   . Trichimoniasis     Past Surgical History:  Procedure Laterality Date  . CHOLECYSTECTOMY    . HERNIA REPAIR      No family history on file.  Social History  Substance Use Topics  . Smoking status: Never Smoker  . Smokeless tobacco: Never Used  . Alcohol use No    Allergies:  Allergies  Allergen Reactions  . Prednisone Anaphylaxis, Hives and Nausea And Vomiting  . Bactrim [Sulfamethoxazole-Trimethoprim] Swelling and Other (See Comments)    Reaction:  Facial swelling  . Flagyl [Metronidazole] Hives and Itching  . Morphine And Related Hives and Itching  . Vicodin [Hydrocodone-Acetaminophen] Hives and Itching  . Zofran [Ondansetron Hcl] Swelling and Other (See Comments)    Reaction:  Facial swelling    Prescriptions Prior to Admission  Medication Sig Dispense Refill Last Dose  . albuterol (PROVENTIL HFA;VENTOLIN  HFA) 108 (90 Base) MCG/ACT inhaler Inhale 1-2 puffs into the lungs every 6 (six) hours as needed for wheezing or shortness of breath. 1 Inhaler 0 Past Week at Unknown time  . cetirizine-pseudoephedrine (ZYRTEC-D) 5-120 MG tablet Take 1 tablet by mouth daily. 14 tablet 0 09/01/2016 at Unknown time  . fluticasone (FLONASE) 50 MCG/ACT nasal spray Place 2 sprays into both nostrils daily. 16 g 0 09/01/2016 at Unknown time  . ibuprofen (ADVIL,MOTRIN) 600 MG tablet Take 1 tablet (600 mg total) by mouth every 6 (six) hours as needed. 30 tablet 1     Review of Systems  Gastrointestinal: Positive for abdominal pain.  Genitourinary: Positive for frequency (3 days) and vaginal discharge. Negative for dysuria, hematuria, urgency and vaginal bleeding.   Physical Exam   Blood pressure 121/65, pulse 81, temperature 97.9 F (36.6 C), temperature source Oral, resp. rate 18, height 5\' 8"  (1.727 m), weight 96.2 kg (212 lb), last menstrual period 10/10/2016.  Physical Exam  Nursing note and vitals reviewed. Constitutional: She is oriented to person, place, and time. She appears well-developed and well-nourished. No distress (appears comfortable).  HENT:  Head: Normocephalic and atraumatic.  Neck: Normal range of motion.  Cardiovascular: Normal rate.   Respiratory: Effort normal.  GI: Soft. She exhibits no distension and no mass. There is no tenderness. There is no rebound, no guarding and no CVA tenderness.  Genitourinary:  Genitourinary Comments: External: no lesions or erythema Vagina: rugated, parous, scant thin white discharge Uterus:  non enlarged, anteverted, non tender, no CMT Adnexae: no masses, no tenderness left, ++ tenderness right   Musculoskeletal: Normal range of motion.       Cervical back: She exhibits no tenderness.       Thoracic back: She exhibits no tenderness.       Lumbar back: She exhibits no tenderness.  Neurological: She is alert and oriented to person, place, and time.  Skin: Skin  is warm and dry.  Psychiatric: She has a normal mood and affect.   Results for orders placed or performed during the hospital encounter of 11/04/16 (from the past 24 hour(s))  Urinalysis, Routine w reflex microscopic     Status: Abnormal   Collection Time: 11/04/16 12:36 PM  Result Value Ref Range   Color, Urine YELLOW YELLOW   APPearance CLEAR CLEAR   Specific Gravity, Urine 1.023 1.005 - 1.030   pH 5.0 5.0 - 8.0   Glucose, UA NEGATIVE NEGATIVE mg/dL   Hgb urine dipstick MODERATE (A) NEGATIVE   Bilirubin Urine NEGATIVE NEGATIVE   Ketones, ur 5 (A) NEGATIVE mg/dL   Protein, ur NEGATIVE NEGATIVE mg/dL   Nitrite NEGATIVE NEGATIVE   Leukocytes, UA NEGATIVE NEGATIVE   RBC / HPF 6-30 0 - 5 RBC/hpf   WBC, UA 0-5 0 - 5 WBC/hpf   Bacteria, UA RARE (A) NONE SEEN   Squamous Epithelial / LPF 0-5 (A) NONE SEEN   Mucous PRESENT   Pregnancy, urine POC     Status: None   Collection Time: 11/04/16 12:48 PM  Result Value Ref Range   Preg Test, Ur NEGATIVE NEGATIVE  Wet prep, genital     Status: None   Collection Time: 11/04/16  2:00 PM  Result Value Ref Range   Yeast Wet Prep HPF POC NONE SEEN NONE SEEN   Trich, Wet Prep NONE SEEN NONE SEEN   Clue Cells Wet Prep HPF POC NONE SEEN NONE SEEN   WBC, Wet Prep HPF POC NONE SEEN NONE SEEN   Sperm NONE SEEN   CBC     Status: Abnormal   Collection Time: 11/04/16  2:21 PM  Result Value Ref Range   WBC 9.3 4.0 - 10.5 K/uL   RBC 3.88 3.87 - 5.11 MIL/uL   Hemoglobin 13.3 12.0 - 15.0 g/dL   HCT 40.3 36.0 - 46.0 %   MCV 103.9 (H) 78.0 - 100.0 fL   MCH 34.3 (H) 26.0 - 34.0 pg   MCHC 33.0 30.0 - 36.0 g/dL   RDW 12.7 11.5 - 15.5 %   Platelets 251 150 - 400 K/uL  Comprehensive metabolic panel     Status: Abnormal   Collection Time: 11/04/16  2:21 PM  Result Value Ref Range   Sodium 136 135 - 145 mmol/L   Potassium 4.1 3.5 - 5.1 mmol/L   Chloride 106 101 - 111 mmol/L   CO2 24 22 - 32 mmol/L   Glucose, Bld 82 65 - 99 mg/dL   BUN 16 6 - 20 mg/dL    Creatinine, Ser 0.96 0.44 - 1.00 mg/dL   Calcium 8.8 (L) 8.9 - 10.3 mg/dL   Total Protein 7.4 6.5 - 8.1 g/dL   Albumin 4.0 3.5 - 5.0 g/dL   AST 18 15 - 41 U/L   ALT 13 (L) 14 - 54 U/L   Alkaline Phosphatase 61 38 - 126 U/L   Total Bilirubin 0.7 0.3 - 1.2 mg/dL   GFR calc non Af Amer >60 >60 mL/min   GFR calc  Af Amer >60 >60 mL/min   Anion gap 6 5 - 15   US Transvaginal Non-ob  Result Date: 11/04/2016 CLINICAL DATA:  Lower abdominal/ right flank pain x3 days EXAM: TRANSABDOMINAL AND TRANSVAGINAL ULTRASOUND OF PELVIS DOPPLER ULTRASOUND OF OVARIES TECHNIQUE: Both transabdominal and transvaginal ultrasound examinations of the pelvis were performed. Transabdominal technique was performed for global imaging of the pelvis including uterus, ovaries, adnexal regions, and pelvic cul-de-sac. It was necessary to proceed with endovaginal exam following the transabdominal exam to visualize the endometrium. Color and duplex Doppler ultrasound was utilized to evaluate blood flow to the ovaries. COMPARISON:  None. FINDINGS: Uterus Measurements: 9.8 x 5.3 x 5.8 cm. Multiple small fibroids measuring up to 1.5 cm in the right uterine fundus. Endometrium Thickness: 13 mm.  No focal abnormality visualized. Right ovary Measurements: 2.1 x 1.6 x 1.9 cm is. Normal appearance/no adnexal mass. Left ovary Measurements: 2.2 x 1.4 x 2.2 cm. Normal appearance/no adnexal mass. Pulsed Doppler evaluation of both ovaries demonstrates normal low-resistance arterial and venous waveforms. Other findings No abnormal free fluid. IMPRESSION: Multiple small uterine fibroids measuring up to 1.5 cm. Endometrial complex measures 13 mm, within normal limits. No evidence of ovarian torsion. Electronically Signed   By: Julian Hy M.D.   On: 11/04/2016 16:15   US Pelvis Complete  Result Date: 11/04/2016 CLINICAL DATA:  Lower abdominal/ right flank pain x3 days EXAM: TRANSABDOMINAL AND TRANSVAGINAL ULTRASOUND OF PELVIS DOPPLER ULTRASOUND OF  OVARIES TECHNIQUE: Both transabdominal and transvaginal ultrasound examinations of the pelvis were performed. Transabdominal technique was performed for global imaging of the pelvis including uterus, ovaries, adnexal regions, and pelvic cul-de-sac. It was necessary to proceed with endovaginal exam following the transabdominal exam to visualize the endometrium. Color and duplex Doppler ultrasound was utilized to evaluate blood flow to the ovaries. COMPARISON:  None. FINDINGS: Uterus Measurements: 9.8 x 5.3 x 5.8 cm. Multiple small fibroids measuring up to 1.5 cm in the right uterine fundus. Endometrium Thickness: 13 mm.  No focal abnormality visualized. Right ovary Measurements: 2.1 x 1.6 x 1.9 cm is. Normal appearance/no adnexal mass. Left ovary Measurements: 2.2 x 1.4 x 2.2 cm. Normal appearance/no adnexal mass. Pulsed Doppler evaluation of both ovaries demonstrates normal low-resistance arterial and venous waveforms. Other findings No abnormal free fluid. IMPRESSION: Multiple small uterine fibroids measuring up to 1.5 cm. Endometrial complex measures 13 mm, within normal limits. No evidence of ovarian torsion. Electronically Signed   By: Julian Hy M.D.   On: 11/04/2016 16:15   [place] Art/ven Flow Abd Pelv Doppler  Result Date: 11/04/2016 CLINICAL DATA:  Lower abdominal/ right flank pain x3 days EXAM: TRANSABDOMINAL AND TRANSVAGINAL ULTRASOUND OF PELVIS DOPPLER ULTRASOUND OF OVARIES TECHNIQUE: Both transabdominal and transvaginal ultrasound examinations of the pelvis were performed. Transabdominal technique was performed for global imaging of the pelvis including uterus, ovaries, adnexal regions, and pelvic cul-de-sac. It was necessary to proceed with endovaginal exam following the transabdominal exam to visualize the endometrium. Color and duplex Doppler ultrasound was utilized to evaluate blood flow to the ovaries. COMPARISON:  None. FINDINGS: Uterus Measurements: 9.8 x 5.3 x 5.8 cm. Multiple small  fibroids measuring up to 1.5 cm in the right uterine fundus. Endometrium Thickness: 13 mm.  No focal abnormality visualized. Right ovary Measurements: 2.1 x 1.6 x 1.9 cm is. Normal appearance/no adnexal mass. Left ovary Measurements: 2.2 x 1.4 x 2.2 cm. Normal appearance/no adnexal mass. Pulsed Doppler evaluation of both ovaries demonstrates normal low-resistance arterial and venous waveforms. Other findings No abnormal  free fluid. IMPRESSION: Multiple small uterine fibroids measuring up to 1.5 cm. Endometrial complex measures 13 mm, within normal limits. No evidence of ovarian torsion. Electronically Signed   By: Julian Hy M.D.   On: 11/04/2016 16:15   MAU Course  Procedures Toradol 30 mg IM  MDM Labs ordered and reviewed. Consult with Dr. Johnnye Sima regarding +trich and allergy to Flagyl >recommend pre-dose of Benadryl prior to administration of Tinidazole. Consult with pharmacist Deloris >no recommendation. Consult with Dr. Rip Harbour > recommend Betadine vaginal douche x7 days, will need TOC. Refer to [facility] for Syphilis follow up and trich TOC. No evidence of acute abdominal or pelvic process. Pt reports her presenting pain was elicited during vaginal ultrasound today. Some relief after Toradol. Pain likely caused by uterine fibroids. Stable for discharge home.  Assessment and Plan   1. Abdominal pain, right lower quadrant   2. Lower abdominal pain   3. History of trichomonal vaginitis   4. History of syphilis   5. Uterine leiomyoma, unspecified location    Discharge home Rx betadine douches x7 days Rx Naproxen Heating pad/warm baths prn pain Follow up with [facility] in 1 week  Allergies as of 11/04/2016      Reactions   Prednisone Anaphylaxis, Hives, Nausea And Vomiting   Bactrim [sulfamethoxazole-trimethoprim] Swelling, Other (See Comments)   Reaction:  Facial swelling   Flagyl [metronidazole] Hives, Itching   Morphine And Related Hives, Itching   Vicodin [hydrocodone-acetaminophen]  Hives, Itching   Zofran [ondansetron Hcl] Swelling, Other (See Comments)   Reaction:  Facial swelling      Medication List    STOP taking these medications   cetirizine-pseudoephedrine 5-120 MG tablet Commonly known as:  ZYRTEC-D   fluticasone 50 MCG/ACT nasal spray Commonly known as:  FLONASE   ibuprofen 600 MG tablet Commonly known as:  ADVIL,MOTRIN     TAKE these medications   albuterol 108 (90 Base) MCG/ACT inhaler Commonly known as:  PROVENTIL HFA;VENTOLIN HFA Inhale 1-2 puffs into the lungs every 6 (six) hours as needed for wheezing or shortness of breath.   naproxen 500 MG tablet Commonly known as:  NAPROSYN Take 1 tablet (500 mg total) by mouth 2 (two) times daily with a meal.   povidone-iodine 0.3 % Soln Commonly known as:  BETADINE Place 133 mLs (1 each total) vaginally at bedtime.      Julianne Handler, CNM 11/04/2016, 1:31 PM

## 2016-11-04 NOTE — MAU Note (Signed)
Diagnosed with trich about 3-4 weeks; allergic to flagyl so pt just got her rx for Tinidazole and has not started anything for the trich yet;

## 2016-11-05 ENCOUNTER — Encounter: Payer: Self-pay | Admitting: *Deleted

## 2016-11-05 ENCOUNTER — Emergency Department
Admission: EM | Admit: 2016-11-05 | Discharge: 2016-11-05 | Disposition: A | Discharge status: Home / Self Care | Payer: Medicaid Other | Attending: Emergency Medicine | Admitting: Emergency Medicine

## 2016-11-05 DIAGNOSIS — Z79899 Other long term (current) drug therapy: Secondary | ICD-10-CM | POA: Diagnosis not present

## 2016-11-05 DIAGNOSIS — J029 Acute pharyngitis, unspecified: Secondary | ICD-10-CM | POA: Diagnosis present

## 2016-11-05 DIAGNOSIS — J020 Streptococcal pharyngitis: Secondary | ICD-10-CM | POA: Diagnosis not present

## 2016-11-05 DIAGNOSIS — J45909 Unspecified asthma, uncomplicated: Secondary | ICD-10-CM | POA: Diagnosis not present

## 2016-11-05 MED ORDER — AMOXICILLIN 500 MG PO TABS: 500.0000 mg | ORAL_TABLET | Freq: Three times a day (TID) | ORAL | 0 refills | Status: DC

## 2016-11-05 NOTE — ED Notes (Signed)
Sore throat X 2 days, worsening today. Denies any other complaints or sx.

## 2016-11-05 NOTE — ED Triage Notes (Signed)
Pt arrived to ED reporting new onset of a sore throat with "white patches" in back of throat. Pt reports this feels similar to last time she was dx with strep. Pt denies SOB or fevers at home. Pt reports difficulty swallowing at this time.

## 2016-11-05 NOTE — Discharge Instructions (Signed)
Take tylenol or ibuprofen for pain or fever. Follow up with the primary care provider for symptoms that are not improving over the next few days. Return to the ER for symptoms that change or worsen if you are unable to schedule an appointment.

## 2016-11-05 NOTE — ED Notes (Signed)
Pt alert and oriented X4, active, cooperative, pt in NAD. RR even and unlabored, color WNL.  Pt informed to return if any life threatening symptoms occur.   

## 2016-11-05 NOTE — ED Notes (Signed)
STREP TEST NEGATIVE.

## 2016-11-07 NOTE — ED Provider Notes (Signed)
[facility] Emergency Department Provider Note  ____________________________________________  Time seen: Approximately 7:47 PM  I have reviewed the triage vital signs and the nursing notes.   HISTORY  Chief Complaint Sore Throat    HPI Kelsey Maynard is a 41 y.o. female who presents to the emergency department for evaluation of sore throat.She states that she's had "white patches" in the back of her throat for the past couple of days. She states that she has felt hot but denies known fever at home. She states that the pain increases with attempting to swallow or eat. She has not taken any medications.  Past Medical History:  Diagnosis Date  . Asthma   . Trichimoniasis     There are no active problems to display for this patient.   Past Surgical History:  Procedure Laterality Date  . CHOLECYSTECTOMY    . HERNIA REPAIR      Prior to Admission medications   Medication Sig Start Date End Date Taking? Authorizing Provider  albuterol (PROVENTIL HFA;VENTOLIN HFA) 108 (90 Base) MCG/ACT inhaler Inhale 1-2 puffs into the lungs every 6 (six) hours as needed for wheezing or shortness of breath. 06/10/16   Olivia Canter Sam, PA-C  amoxicillin (AMOXIL) 500 MG tablet Take 1 tablet (500 mg total) by mouth 3 (three) times daily. 11/05/16   Victorino Dike, FNP  naproxen (NAPROSYN) 500 MG tablet Take 1 tablet (500 mg total) by mouth 2 (two) times daily with a meal. 11/04/16   Julianne Handler, CNM  povidone-iodine (BETADINE) 0.3 % SOLN Place 133 mLs (1 each total) vaginally at bedtime. 11/04/16   Julianne Handler, CNM    Allergies Prednisone; Bactrim [sulfamethoxazole-trimethoprim]; Flagyl [metronidazole]; Morphine and related; Vicodin [hydrocodone-acetaminophen]; and Zofran [ondansetron hcl]  History reviewed. No pertinent family history.  Social History Social History  Substance Use Topics  . Smoking status: Never Smoker  . Smokeless tobacco: Never Used  . Alcohol use No     Review of Systems Constitutional: Questionable for fever. Eyes: No visual changes. ENT: Positive for sore throat; positive for difficulty swallowing. Respiratory: Denies shortness of breath. Gastrointestinal: No abdominal pain.  No nausea, no vomiting.  No diarrhea.  Genitourinary: Negative for dysuria. Musculoskeletal: Negative for generalized body aches. Skin: Negative for rash. Neurological: Negative for headaches, focal weakness or numbness.  ____________________________________________   PHYSICAL EXAM:  VITAL SIGNS: ED Triage Vitals  Enc Vitals Group     BP 11/05/16 1909 128/73     Pulse Rate 11/05/16 1909 83     Resp 11/05/16 1909 16     Temp 11/05/16 1909 98.3 F (36.8 C)     Temp Source 11/05/16 1909 Oral     SpO2 11/05/16 1909 100 %     Weight 11/05/16 1907 212 lb (96.2 kg)     Height 11/05/16 1907 5\' 8"  (1.727 m)     Head Circumference --      Peak Flow --      Pain Score 11/05/16 1907 8     Pain Loc --      Pain Edu? --      Excl. in [place]? --    Constitutional: Alert and oriented. Well appearing and in no acute distress. Eyes: Conjunctivae are normal. PERRL. EOMI. Head: Atraumatic. Nose: No congestion/rhinnorhea. Mouth/Throat: Mucous membranes are moist.  Oropharynx Erythematous, tonsils 2+ bilaterally with exudate. Neck: No stridor.  Lymphatic: Lymphadenopathy: Bilateral anterior cervical lymphadenopathy that is tender upon palpation. Cardiovascular: Normal rate, regular rhythm. Good peripheral circulation. Respiratory: Normal  respiratory effort. Lungs CTAB. Gastrointestinal: Soft and nontender. Musculoskeletal: No lower extremity tenderness nor edema.   Neurologic:  Normal speech and language. No gross focal neurologic deficits are appreciated. Speech is normal. No gait instability. Skin:  Skin is warm, dry and intact. No rash noted Psychiatric: Mood and affect are normal. Speech and behavior are  normal.  ____________________________________________   LABS (all labs ordered are listed, but only abnormal results are displayed)  Labs Reviewed - No data to display ____________________________________________  EKG  Not indicated ____________________________________________  RADIOLOGY  Not indicated ____________________________________________   PROCEDURES  Procedure(s) performed: None  Critical Care performed: No ____________________________________________   INITIAL IMPRESSION / ASSESSMENT AND PLAN / ED COURSE  Pertinent labs & imaging results that were available during my care of the patient were reviewed by me and considered in my medical decision making (see chart for details).  41 year old female presenting to the emergency department for sore throat. Baseline symptoms and exam, she will be treated for streptococcal pharyngitis. She'll be given a prescription for amoxicillin and advised to take Tylenol or ibuprofen if needed for pain or fever. She was instructed to follow-up with her primary care provider for symptoms that are not improving over the next few days. She was instructed to return to the emergency department for symptoms that change or worsen if she is unable schedule an appointment. ____________________________________________  Discharge Medication List as of 11/05/2016  8:07 PM    START taking these medications   Details  amoxicillin (AMOXIL) 500 MG tablet Take 1 tablet (500 mg total) by mouth 3 (three) times daily., Starting Fri 11/05/2016, Print        FINAL CLINICAL IMPRESSION(S) / ED DIAGNOSES  Final diagnoses:  Acute streptococcal pharyngitis    If controlled substance prescribed during this visit, 12 month history viewed on the [place] prior to issuing an initial prescription for Schedule II or III opiod.   Note:  This document was prepared using Dragon voice recognition software and may include unintentional dictation errors.    Victorino Dike, FNP 11/07/16 [place], MD 11/09/16 [phone]

## 2016-11-08 LAB — GC/CHLAMYDIA PROBE AMP (~~LOC~~) NOT AT ARMC
CHLAMYDIA, DNA PROBE: NEGATIVE
Neisseria Gonorrhea: NEGATIVE

## 2016-11-16 ENCOUNTER — Inpatient Hospital Stay (HOSPITAL_COMMUNITY)
Admission: AD | Admit: 2016-11-16 | Discharge: 2016-11-16 | Discharge status: Against Medical Advice | Payer: Medicaid Other | Source: Ambulatory Visit | Attending: Family Medicine | Admitting: Family Medicine

## 2016-11-16 ENCOUNTER — Encounter (HOSPITAL_COMMUNITY): Payer: Self-pay | Admitting: *Deleted

## 2016-11-16 DIAGNOSIS — R1031 Right lower quadrant pain: Secondary | ICD-10-CM | POA: Insufficient documentation

## 2016-11-16 DIAGNOSIS — Z5321 Procedure and treatment not carried out due to patient leaving prior to being seen by health care provider: Secondary | ICD-10-CM | POA: Diagnosis not present

## 2016-11-16 LAB — URINALYSIS, ROUTINE W REFLEX MICROSCOPIC
Bacteria, UA: NONE SEEN
Bilirubin Urine: NEGATIVE
Glucose, UA: NEGATIVE mg/dL
KETONES UR: NEGATIVE mg/dL
Leukocytes, UA: NEGATIVE
Nitrite: NEGATIVE
PROTEIN: NEGATIVE mg/dL
Specific Gravity, Urine: 1.024 (ref 1.005–1.030)
pH: 5 (ref 5.0–8.0)

## 2016-11-16 LAB — POCT PREGNANCY, URINE: Preg Test, Ur: NEGATIVE

## 2016-11-16 NOTE — MAU Note (Signed)
Pt C/O RLQ pain for the last 3 days, hx of fibroids.  Just started period today.

## 2016-11-16 NOTE — Progress Notes (Signed)
Pt came to nurses station stating she needed to sign AMA from due to her mother being in the hospital. [place] form was signed and pt left.

## 2016-12-07 ENCOUNTER — Emergency Department
Admission: EM | Admit: 2016-12-07 | Discharge: 2016-12-07 | Disposition: A | Discharge status: Home / Self Care | Payer: Medicaid Other | Attending: Emergency Medicine | Admitting: Emergency Medicine

## 2016-12-07 DIAGNOSIS — Z5321 Procedure and treatment not carried out due to patient leaving prior to being seen by health care provider: Secondary | ICD-10-CM | POA: Insufficient documentation

## 2016-12-07 DIAGNOSIS — R350 Frequency of micturition: Secondary | ICD-10-CM | POA: Diagnosis present

## 2016-12-07 DIAGNOSIS — J45909 Unspecified asthma, uncomplicated: Secondary | ICD-10-CM | POA: Insufficient documentation

## 2016-12-07 LAB — POCT PREGNANCY, URINE: Preg Test, Ur: NEGATIVE

## 2016-12-07 NOTE — ED Notes (Signed)
Pt states urinary frequency since Saturday, states "its not like an STD it just feels like a UTI", states lower abd pressure, pt awake and alert in no acute distress

## 2016-12-07 NOTE — ED Triage Notes (Signed)
Pt c/o painful urination with increased frequency for the past 3-4 days.

## 2017-02-08 ENCOUNTER — Inpatient Hospital Stay (HOSPITAL_COMMUNITY)
Admission: AD | Admit: 2017-02-08 | Discharge: 2017-02-08 | Disposition: A | Discharge status: Home / Self Care | Payer: Medicaid Other | Source: Ambulatory Visit | Attending: Obstetrics & Gynecology | Admitting: Obstetrics & Gynecology

## 2017-02-08 ENCOUNTER — Encounter (HOSPITAL_COMMUNITY): Payer: Self-pay | Admitting: *Deleted

## 2017-02-08 DIAGNOSIS — Z885 Allergy status to narcotic agent status: Secondary | ICD-10-CM | POA: Insufficient documentation

## 2017-02-08 DIAGNOSIS — J45909 Unspecified asthma, uncomplicated: Secondary | ICD-10-CM | POA: Diagnosis not present

## 2017-02-08 DIAGNOSIS — N939 Abnormal uterine and vaginal bleeding, unspecified: Secondary | ICD-10-CM | POA: Diagnosis not present

## 2017-02-08 DIAGNOSIS — Z882 Allergy status to sulfonamides status: Secondary | ICD-10-CM | POA: Insufficient documentation

## 2017-02-08 LAB — WET PREP, GENITAL
CLUE CELLS WET PREP: NONE SEEN
SPERM: NONE SEEN
Trich, Wet Prep: NONE SEEN
Yeast Wet Prep HPF POC: NONE SEEN

## 2017-02-08 LAB — URINALYSIS, ROUTINE W REFLEX MICROSCOPIC
BACTERIA UA: NONE SEEN
Bilirubin Urine: NEGATIVE
GLUCOSE, UA: NEGATIVE mg/dL
Ketones, ur: NEGATIVE mg/dL
Leukocytes, UA: NEGATIVE
NITRITE: NEGATIVE
Protein, ur: NEGATIVE mg/dL
SPECIFIC GRAVITY, URINE: 1.026 (ref 1.005–1.030)
pH: 5 (ref 5.0–8.0)

## 2017-02-08 LAB — CBC
HEMATOCRIT: 37.5 % (ref 36.0–46.0)
HEMOGLOBIN: 12.3 g/dL (ref 12.0–15.0)
MCH: 33.9 pg (ref 26.0–34.0)
MCHC: 32.8 g/dL (ref 30.0–36.0)
MCV: 103.3 fL — AB (ref 78.0–100.0)
Platelets: 281 10*3/uL (ref 150–400)
RBC: 3.63 MIL/uL — ABNORMAL LOW (ref 3.87–5.11)
RDW: 12.7 % (ref 11.5–15.5)
WBC: 6.7 10*3/uL (ref 4.0–10.5)

## 2017-02-08 MED ORDER — KETOROLAC TROMETHAMINE 60 MG/2ML IM SOLN
60.0000 mg | INTRAMUSCULAR | Status: AC
  Administered 2017-02-08: 60 mg via INTRAMUSCULAR
  Filled 2017-02-08: qty 2

## 2017-02-08 MED ORDER — MEGESTROL ACETATE 20 MG PO TABS: 40.0000 mg | ORAL_TABLET | Freq: Two times a day (BID) | ORAL | 0 refills | Status: DC

## 2017-02-08 MED ORDER — MEGESTROL ACETATE 20 MG PO TABS: 40.0000 mg | ORAL_TABLET | Freq: Every day | ORAL | 3 refills | Status: DC

## 2017-02-08 NOTE — MAU Provider Note (Signed)
Chief Complaint: No chief complaint on file.   First Provider Initiated Contact with Patient 02/08/17 1253      SUBJECTIVE HPI: Kelsey Maynard is a 41 y.o. [id number] who presents to maternity admissions reporting onset of heavy vaginal bleeding 3 days ago reporting she has used 3 super tampon boxes in 3 days. She reports she usually has regular periods but had 3 periods last month and then this episode of bleeding. The bleeding is associated with intermittent lower abdominal cramping and she has tried Aleve, ibuprofen, and Tylenol which have not helped. Her last dose of medicine was yesterday, she has not taken any medication today. There are no other associated symptoms. She denies vaginal itching/burning, urinary symptoms, h/a, dizziness, n/v, or fever/chills.    Bleeding x 3 days heavy with clots, 3 super tampon boxes  HPI  Past Medical History:  Diagnosis Date  . Asthma   . Trichimoniasis    Past Surgical History:  Procedure Laterality Date  . CHOLECYSTECTOMY    . HERNIA REPAIR     Social History   Social History  . Marital status: Single    Spouse name: N/A  . Number of children: N/A  . Years of education: N/A   Occupational History  . Not on file.   Social History Main Topics  . Smoking status: Never Smoker  . Smokeless tobacco: Never Used  . Alcohol use No  . Drug use: No  . Sexual activity: No   Other Topics Concern  . Not on file   Social History Narrative  . No narrative on file   No current facility-administered medications on file prior to encounter.    Current Outpatient Prescriptions on File Prior to Encounter  Medication Sig Dispense Refill  . albuterol (PROVENTIL HFA;VENTOLIN HFA) 108 (90 Base) MCG/ACT inhaler Inhale 1-2 puffs into the lungs every 6 (six) hours as needed for wheezing or shortness of breath. 1 Inhaler 0  . naproxen (NAPROSYN) 500 MG tablet Take 1 tablet (500 mg total) by mouth 2 (two) times daily with a meal. (Patient taking  differently: Take 500 mg by mouth 2 (two) times daily as needed for moderate pain. ) 20 tablet 0  . amoxicillin (AMOXIL) 500 MG tablet Take 1 tablet (500 mg total) by mouth 3 (three) times daily. (Patient not taking: Reported on 02/08/2017) 30 tablet 0  . povidone-iodine (BETADINE) 0.3 % SOLN Place 133 mLs (1 each total) vaginally at bedtime. (Patient not taking: Reported on 02/08/2017) 7 Bottle 0   Allergies  Allergen Reactions  . Prednisone Anaphylaxis, Hives and Nausea And Vomiting  . Bactrim [Sulfamethoxazole-Trimethoprim] Swelling and Other (See Comments)    Reaction:  Facial swelling  . Flagyl [Metronidazole] Hives and Itching  . Morphine And Related Hives and Itching  . Vicodin [Hydrocodone-Acetaminophen] Hives and Itching  . Zofran [Ondansetron Hcl] Swelling and Other (See Comments)    Reaction:  Facial swelling    ROS:  Review of Systems  Constitutional: Negative for chills, fatigue and fever.  Respiratory: Negative for shortness of breath.   Cardiovascular: Negative for chest pain.  Gastrointestinal: Positive for abdominal pain. Negative for nausea and vomiting.  Genitourinary: Positive for pelvic pain and vaginal bleeding. Negative for difficulty urinating, dysuria, flank pain, vaginal discharge and vaginal pain.  Neurological: Negative for dizziness and headaches.  Psychiatric/Behavioral: Negative.      I have reviewed patient's Past Medical Hx, Surgical Hx, Family Hx, Social Hx, medications and allergies.   Physical Exam  Patient Vitals for the  past 24 hrs:  BP Temp Temp src Pulse Resp SpO2 Weight  02/08/17 1143 109/75 98.6 F (37 C) Oral 91 18 99 % 208 lb 1.3 oz (94.4 kg)   Constitutional: Well-developed, well-nourished female in no acute distress.  Cardiovascular: normal rate Respiratory: normal effort GI: Abd soft, non-tender. Pos BS x 4 MS: Extremities nontender, no edema, normal ROM Neurologic: Alert and oriented x 4.  GU: Neg CVAT.  PELVIC EXAM: Cervix  pink, visually closed, without lesion, small amount dark red bleeding with small clots, vaginal walls and external genitalia normal Bimanual exam: Cervix 0/long/high, firm, anterior, neg CMT, uterus nontender, slightly enlarged and irregular, adnexa without tenderness, enlargement, or mass   LAB RESULTS Results for orders placed or performed during the hospital encounter of 02/08/17 (from the past 24 hour(s))  Urinalysis, Routine w reflex microscopic     Status: Abnormal   Collection Time: 02/08/17 11:46 AM  Result Value Ref Range   Color, Urine YELLOW YELLOW   APPearance CLEAR CLEAR   Specific Gravity, Urine 1.026 1.005 - 1.030   pH 5.0 5.0 - 8.0   Glucose, UA NEGATIVE NEGATIVE mg/dL   Hgb urine dipstick MODERATE (A) NEGATIVE   Bilirubin Urine NEGATIVE NEGATIVE   Ketones, ur NEGATIVE NEGATIVE mg/dL   Protein, ur NEGATIVE NEGATIVE mg/dL   Nitrite NEGATIVE NEGATIVE   Leukocytes, UA NEGATIVE NEGATIVE   RBC / HPF 0-5 0 - 5 RBC/hpf   WBC, UA 0-5 0 - 5 WBC/hpf   Bacteria, UA NONE SEEN NONE SEEN   Squamous Epithelial / LPF 0-5 (A) NONE SEEN   Mucous PRESENT   CBC     Status: Abnormal   Collection Time: 02/08/17 12:19 PM  Result Value Ref Range   WBC 6.7 4.0 - 10.5 K/uL   RBC 3.63 (L) 3.87 - 5.11 MIL/uL   Hemoglobin 12.3 12.0 - 15.0 g/dL   HCT 37.5 36.0 - 46.0 %   MCV 103.3 (H) 78.0 - 100.0 fL   MCH 33.9 26.0 - 34.0 pg   MCHC 32.8 30.0 - 36.0 g/dL   RDW 12.7 11.5 - 15.5 %   Platelets 281 150 - 400 K/uL  Wet prep, genital     Status: Abnormal   Collection Time: 02/08/17  1:00 PM  Result Value Ref Range   Yeast Wet Prep HPF POC NONE SEEN NONE SEEN   Trich, Wet Prep NONE SEEN NONE SEEN   Clue Cells Wet Prep HPF POC NONE SEEN NONE SEEN   WBC, Wet Prep HPF POC FEW (A) NONE SEEN   Sperm NONE SEEN        IMAGING No results found.  MAU Management/MDM: Ordered labs and reviewed results.  No acute abdomen noted, pt not anemic with no symptoms and hgb 12.3.  Start Megace daily and  will follow patient in office, consider endometrial biopsy if bleeding persists.  Pt to apply for [facility] discount at her appointment or before.  Treatments in MAU included Toradol 60 mg IM with improvement in pt pain.  Recommend she continue Aleve OTC BID.  Pt stable at time of discharge.  ASSESSMENT 1. Abnormal uterine bleeding (AUB)     PLAN Discharge home Allergies as of 02/08/2017      Reactions   Prednisone Anaphylaxis, Hives, Nausea And Vomiting   Bactrim [sulfamethoxazole-trimethoprim] Swelling, Other (See Comments)   Reaction:  Facial swelling   Flagyl [metronidazole] Hives, Itching   Morphine And Related Hives, Itching   Vicodin [hydrocodone-acetaminophen] Hives, Itching   Zofran [  ondansetron Hcl] Swelling, Other (See Comments)   Reaction:  Facial swelling      Medication List    STOP taking these medications   amoxicillin 500 MG tablet Commonly known as:  AMOXIL   ibuprofen 200 MG tablet Commonly known as:  ADVIL,MOTRIN   naproxen sodium 220 MG tablet Commonly known as:  ANAPROX   povidone-iodine 0.3 % Soln Commonly known as:  BETADINE     TAKE these medications   albuterol 108 (90 Base) MCG/ACT inhaler Commonly known as:  PROVENTIL HFA;VENTOLIN HFA Inhale 1-2 puffs into the lungs every 6 (six) hours as needed for wheezing or shortness of breath.   megestrol 20 MG tablet Commonly known as:  MEGACE Take 2 tablets (40 mg total) by mouth 2 (two) times daily.   megestrol 20 MG tablet Commonly known as:  MEGACE Take 2 tablets (40 mg total) by mouth daily.   naproxen 500 MG tablet Commonly known as:  NAPROSYN Take 1 tablet (500 mg total) by mouth 2 (two) times daily with a meal. What changed:  when to take this  reasons to take this      [facility] [place] Follow up.   Specialty:  Obstetrics and Gynecology Why:  The office will call you with an appointment with OB/Gyn.  Return to MAU as needed for  emergencies. Contact information: [place] Certified Nurse-Midwife 02/08/2017  2:14 PM

## 2017-02-08 NOTE — MAU Note (Signed)
+  lower abdominal cramping Started yesterday Rating 10/10 Has tried ibuprofen and aleve, midol  +vaginal bleeding  Started Sunday States passing blood clots; states size of 50 cent piece  Wearing super plus tampons; states has gone through 5 18 count boxes since sunday  LMP irregular

## 2017-02-09 ENCOUNTER — Encounter: Payer: Self-pay | Admitting: General Practice

## 2017-02-09 LAB — GC/CHLAMYDIA PROBE AMP (~~LOC~~) NOT AT ARMC
Chlamydia: NEGATIVE
NEISSERIA GONORRHEA: NEGATIVE

## 2017-02-21 ENCOUNTER — Emergency Department: Payer: Medicaid Other

## 2017-02-21 ENCOUNTER — Encounter: Payer: Self-pay | Admitting: Emergency Medicine

## 2017-02-21 ENCOUNTER — Emergency Department
Admission: EM | Admit: 2017-02-21 | Discharge: 2017-02-21 | Disposition: A | Discharge status: Home / Self Care | Payer: Medicaid Other | Attending: Emergency Medicine | Admitting: Emergency Medicine

## 2017-02-21 DIAGNOSIS — J45909 Unspecified asthma, uncomplicated: Secondary | ICD-10-CM | POA: Diagnosis not present

## 2017-02-21 DIAGNOSIS — Y929 Unspecified place or not applicable: Secondary | ICD-10-CM | POA: Insufficient documentation

## 2017-02-21 DIAGNOSIS — S90931A Unspecified superficial injury of right great toe, initial encounter: Secondary | ICD-10-CM | POA: Diagnosis present

## 2017-02-21 DIAGNOSIS — Y999 Unspecified external cause status: Secondary | ICD-10-CM | POA: Diagnosis not present

## 2017-02-21 DIAGNOSIS — S90111A Contusion of right great toe without damage to nail, initial encounter: Secondary | ICD-10-CM | POA: Insufficient documentation

## 2017-02-21 DIAGNOSIS — Y939 Activity, unspecified: Secondary | ICD-10-CM | POA: Diagnosis not present

## 2017-02-21 MED ORDER — MELOXICAM 7.5 MG PO TABS: 7.5000 mg | ORAL_TABLET | Freq: Every day | ORAL | 1 refills | Status: AC
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
## 2017-02-21 NOTE — ED Notes (Signed)
Injured toe buddy wrapped at this time

## 2017-03-08 ENCOUNTER — Emergency Department
Admission: EM | Admit: 2017-03-08 | Discharge: 2017-03-08 | Disposition: A | Discharge status: Home / Self Care | Payer: Medicaid Other | Attending: Emergency Medicine | Admitting: Emergency Medicine

## 2017-03-08 ENCOUNTER — Encounter: Payer: Self-pay | Admitting: *Deleted

## 2017-03-08 DIAGNOSIS — G4489 Other headache syndrome: Secondary | ICD-10-CM | POA: Insufficient documentation

## 2017-03-08 DIAGNOSIS — Z79818 Long term (current) use of other agents affecting estrogen receptors and estrogen levels: Secondary | ICD-10-CM | POA: Insufficient documentation

## 2017-03-08 DIAGNOSIS — J45909 Unspecified asthma, uncomplicated: Secondary | ICD-10-CM | POA: Insufficient documentation

## 2017-03-08 MED ORDER — ACETAMINOPHEN 500 MG PO TABS
1000.0000 mg | ORAL_TABLET | Freq: Once | ORAL | Status: AC
  Administered 2017-03-08: 1000 mg via ORAL

## 2017-03-08 MED ORDER — KETOROLAC TROMETHAMINE 30 MG/ML IJ SOLN
15.0000 mg | Freq: Once | INTRAMUSCULAR | Status: AC
  Administered 2017-03-08: 15 mg via INTRAVENOUS

## 2017-03-08 MED ORDER — PROCHLORPERAZINE EDISYLATE 5 MG/ML IJ SOLN
INTRAMUSCULAR | Status: AC
  Administered 2017-03-08: 10 mg via INTRAVENOUS
  Filled 2017-03-08: qty 2

## 2017-03-08 MED ORDER — PROCHLORPERAZINE EDISYLATE 5 MG/ML IJ SOLN
10.0000 mg | Freq: Once | INTRAMUSCULAR | Status: AC
  Administered 2017-03-08: 10 mg via INTRAVENOUS

## 2017-03-08 MED ORDER — KETOROLAC TROMETHAMINE 30 MG/ML IJ SOLN
INTRAMUSCULAR | Status: AC
  Administered 2017-03-08: 15 mg via INTRAVENOUS
  Filled 2017-03-08: qty 1

## 2017-03-08 MED ORDER — SODIUM CHLORIDE 0.9 % IV BOLUS (SEPSIS)
1000.0000 mL | Freq: Once | INTRAVENOUS | Status: AC
  Administered 2017-03-08: 1000 mL via INTRAVENOUS

## 2017-03-08 MED ORDER — ACETAMINOPHEN 500 MG PO TABS
ORAL_TABLET | ORAL | Status: AC
  Administered 2017-03-08: 1000 mg via ORAL
  Filled 2017-03-08: qty 2

## 2017-03-08 MED ORDER — BUTALBITAL-APAP-CAFFEINE 50-325-40 MG PO TABS: 1.0000 | ORAL_TABLET | Freq: Four times a day (QID) | ORAL | 0 refills | Status: AC | PRN

## 2017-03-08 MED ORDER — DIPHENHYDRAMINE HCL 50 MG/ML IJ SOLN
25.0000 mg | Freq: Once | INTRAMUSCULAR | Status: AC
  Administered 2017-03-08: 25 mg via INTRAVENOUS

## 2017-03-08 MED ORDER — DIPHENHYDRAMINE HCL 50 MG/ML IJ SOLN
INTRAMUSCULAR | Status: AC
  Administered 2017-03-08: 25 mg via INTRAVENOUS
  Filled 2017-03-08: qty 1

## 2017-03-08 NOTE — ED Provider Notes (Signed)
[facility] Emergency Department Provider Note  ____________________________________________   First MD Initiated Contact with Patient 03/08/17 2029     (approximate)  I have reviewed the triage vital signs and the nursing notes.   HISTORY  Chief Complaint Migraine   HPI Kelsey Maynard is a 41 y.o. female who self presents to the emergency department with 1 week of gradual onset not maximal onset bifrontal throbbing headache. She normally does not get headaches and this feels different from anything she has ever had before. She's had nausea but no vomiting. She has had some photophobia. She has never been diagnosed with migraines. She's had no neck pain or stiffness. No fevers or chills. No chest pain or shortness of breath. Light makes the pain worse rest makes the pain better.   Past Medical History:  Diagnosis Date  . Asthma   . Trichimoniasis     There are no active problems to display for this patient.   Past Surgical History:  Procedure Laterality Date  . CHOLECYSTECTOMY    . HERNIA REPAIR      Prior to Admission medications   Medication Sig Start Date End Date Taking? Authorizing Provider  albuterol (PROVENTIL HFA;VENTOLIN HFA) 108 (90 Base) MCG/ACT inhaler Inhale 1-2 puffs into the lungs every 6 (six) hours as needed for wheezing or shortness of breath. 06/10/16   Sam, Olivia Canter, PA-C  butalbital-acetaminophen-caffeine Carlos, ESGIC) [phone] MG tablet Take 1-2 tablets by mouth every 6 (six) hours as needed for headache. 03/08/17 03/08/18  Darel Hong, MD  megestrol (MEGACE) 20 MG tablet Take 2 tablets (40 mg total) by mouth daily. 02/08/17   Leftwich-Kirby, Kathie Dike, CNM  megestrol (MEGACE) 20 MG tablet Take 2 tablets (40 mg total) by mouth 2 (two) times daily. 02/08/17   Leftwich-Kirby, Kathie Dike, CNM  naproxen (NAPROSYN) 500 MG tablet Take 1 tablet (500 mg total) by mouth 2 (two) times daily with a meal. Patient taking differently: Take 500  mg by mouth 2 (two) times daily as needed for moderate pain.  11/04/16   Julianne Handler, CNM    Allergies Prednisone; Bactrim [sulfamethoxazole-trimethoprim]; Flagyl [metronidazole]; Morphine and related; Vicodin [hydrocodone-acetaminophen]; and Zofran [ondansetron hcl]  No family history on file.  Social History Social History  Substance Use Topics  . Smoking status: Never Smoker  . Smokeless tobacco: Never Used  . Alcohol use No    Review of Systems Constitutional: No fever/chills Eyes: No visual changes. ENT: No sore throat. Cardiovascular: Denies chest pain. Respiratory: Denies shortness of breath. Gastrointestinal: No abdominal pain.  No nausea, no vomiting.  No diarrhea.  No constipation. Genitourinary: Negative for dysuria. Musculoskeletal: Negative for back pain. Skin: Negative for rash. Neurological: Positive for headache negative for numbness or weakness   ____________________________________________   PHYSICAL EXAM:  VITAL SIGNS: ED Triage Vitals [03/08/17 1949]  Enc Vitals Group     BP 130/81     Pulse Rate 78     Resp 20     Temp 98.6 F (37 C)     Temp Source Oral     SpO2 100 %     Weight 208 lb (94.3 kg)     Height 5\' 8"  (1.727 m)     Head Circumference      Peak Flow      Pain Score 7     Pain Loc      Pain Edu?      Excl. in [place]?     Constitutional: Alert and oriented  4 appears uncomfortable sitting in a dark room shielding her eyes Eyes: PERRL EOMI. pupils midrange and brisk Head: Atraumatic. Nose: No congestion/rhinnorhea. Mouth/Throat: No trismus Neck: No stridor.  No meningismus Cardiovascular: Normal rate, regular rhythm. Grossly normal heart sounds.  Good peripheral circulation. Respiratory: Normal respiratory effort.  No retractions. Lungs CTAB and moving good air Gastrointestinal: Soft nontender Musculoskeletal: No lower extremity edema   Neurologic:  Normal speech and language. No gross focal neurologic deficits are  appreciated. Skin:  Skin is warm, dry and intact. No rash noted. Psychiatric: Mood and affect are normal. Speech and behavior are normal.    ____________________________________________   DIFFERENTIAL includes but not limited to  Migraine headache, meningitis, cluster headache, tension headache ___________________________________________   LABS (all labs ordered are listed, but only abnormal results are displayed)  Labs Reviewed - No data to display   __________________________________________  EKG  ____________________________________________  RADIOLOGY   ____________________________________________   PROCEDURES  Procedure(s) performed: no  Procedures  Critical Care performed: no  Observation: no ____________________________________________   INITIAL IMPRESSION / ASSESSMENT AND PLAN / ED COURSE  Pertinent labs & imaging results that were available during my care of the patient were reviewed by me and considered in my medical decision making (see chart for details).  The patient arrives uncomfortable appearing with severe headache that has been gradually onset for the past week. Do not suspect meningitis or subarachnoid hemorrhage or other intracerebral catastrophe. We will give a trial of Compazine and Benadryl and reevaluate.  After Compazine and Benadryl and Toradol the patient's pain is significantly improved. Unfortunately she has anaphylaxis to prednisone so I'm not comfortable giving her Decadron to help prevent recurrence. I will prescribe her Fioricet and refer her back to primary care. She is discharged home in improved condition.      ____________________________________________   FINAL CLINICAL IMPRESSION(S) / ED DIAGNOSES  Final diagnoses:  Other headache syndrome      NEW MEDICATIONS STARTED DURING THIS VISIT:  New Prescriptions   BUTALBITAL-ACETAMINOPHEN-CAFFEINE (FIORICET, ESGIC) 50-325-40 MG TABLET    Take 1-2 tablets by mouth every  6 (six) hours as needed for headache.     Note:  This document was prepared using Dragon voice recognition software and may include unintentional dictation errors.     Darel Hong, MD 03/08/17 2208

## 2017-03-08 NOTE — ED Triage Notes (Signed)
Pt complains of a migraine for 1 week, pt denies having a history of migraines, pt denies any vision changes or any other symptoms, pt reports taking motrin at home with no relief

## 2017-03-08 NOTE — ED Notes (Signed)
ED Provider at bedside. 

## 2017-03-08 NOTE — Discharge Instructions (Signed)
Please make an appointment to establish care with a primary care physician within the next week or so for reevaluation. Return to the emergency department for any concerns such as worsening headache, fever or chills, or for any other concerns.  It was a pleasure to take care of you today, and thank you for coming to our emergency department.  If you have any questions or concerns before leaving please ask the nurse to grab me and I'm more than happy to go through your aftercare instructions again.  If you were prescribed any opioid pain medication today such as Norco, Vicodin, Percocet, morphine, hydrocodone, or oxycodone please make sure you do not drive when you are taking this medication as it can alter your ability to drive safely.  If you have any concerns once you are home that you are not improving or are in fact getting worse before you can make it to your follow-up appointment, please do not hesitate to call [phone] and come back for further evaluation.  Darel Hong MD

## 2017-03-16 ENCOUNTER — Encounter: Payer: Self-pay | Admitting: Obstetrics & Gynecology

## 2017-03-18 IMAGING — DX DG CHEST 2V
2 series · 2 of 2 positions shown · non-contrast
Comparison: Radiographs [DATE]

CLINICAL DATA: Chest pain, shortness of breath, fever for 2 days.
History of asthma.

EXAM:
CHEST  2 VIEW

[w chest pa]
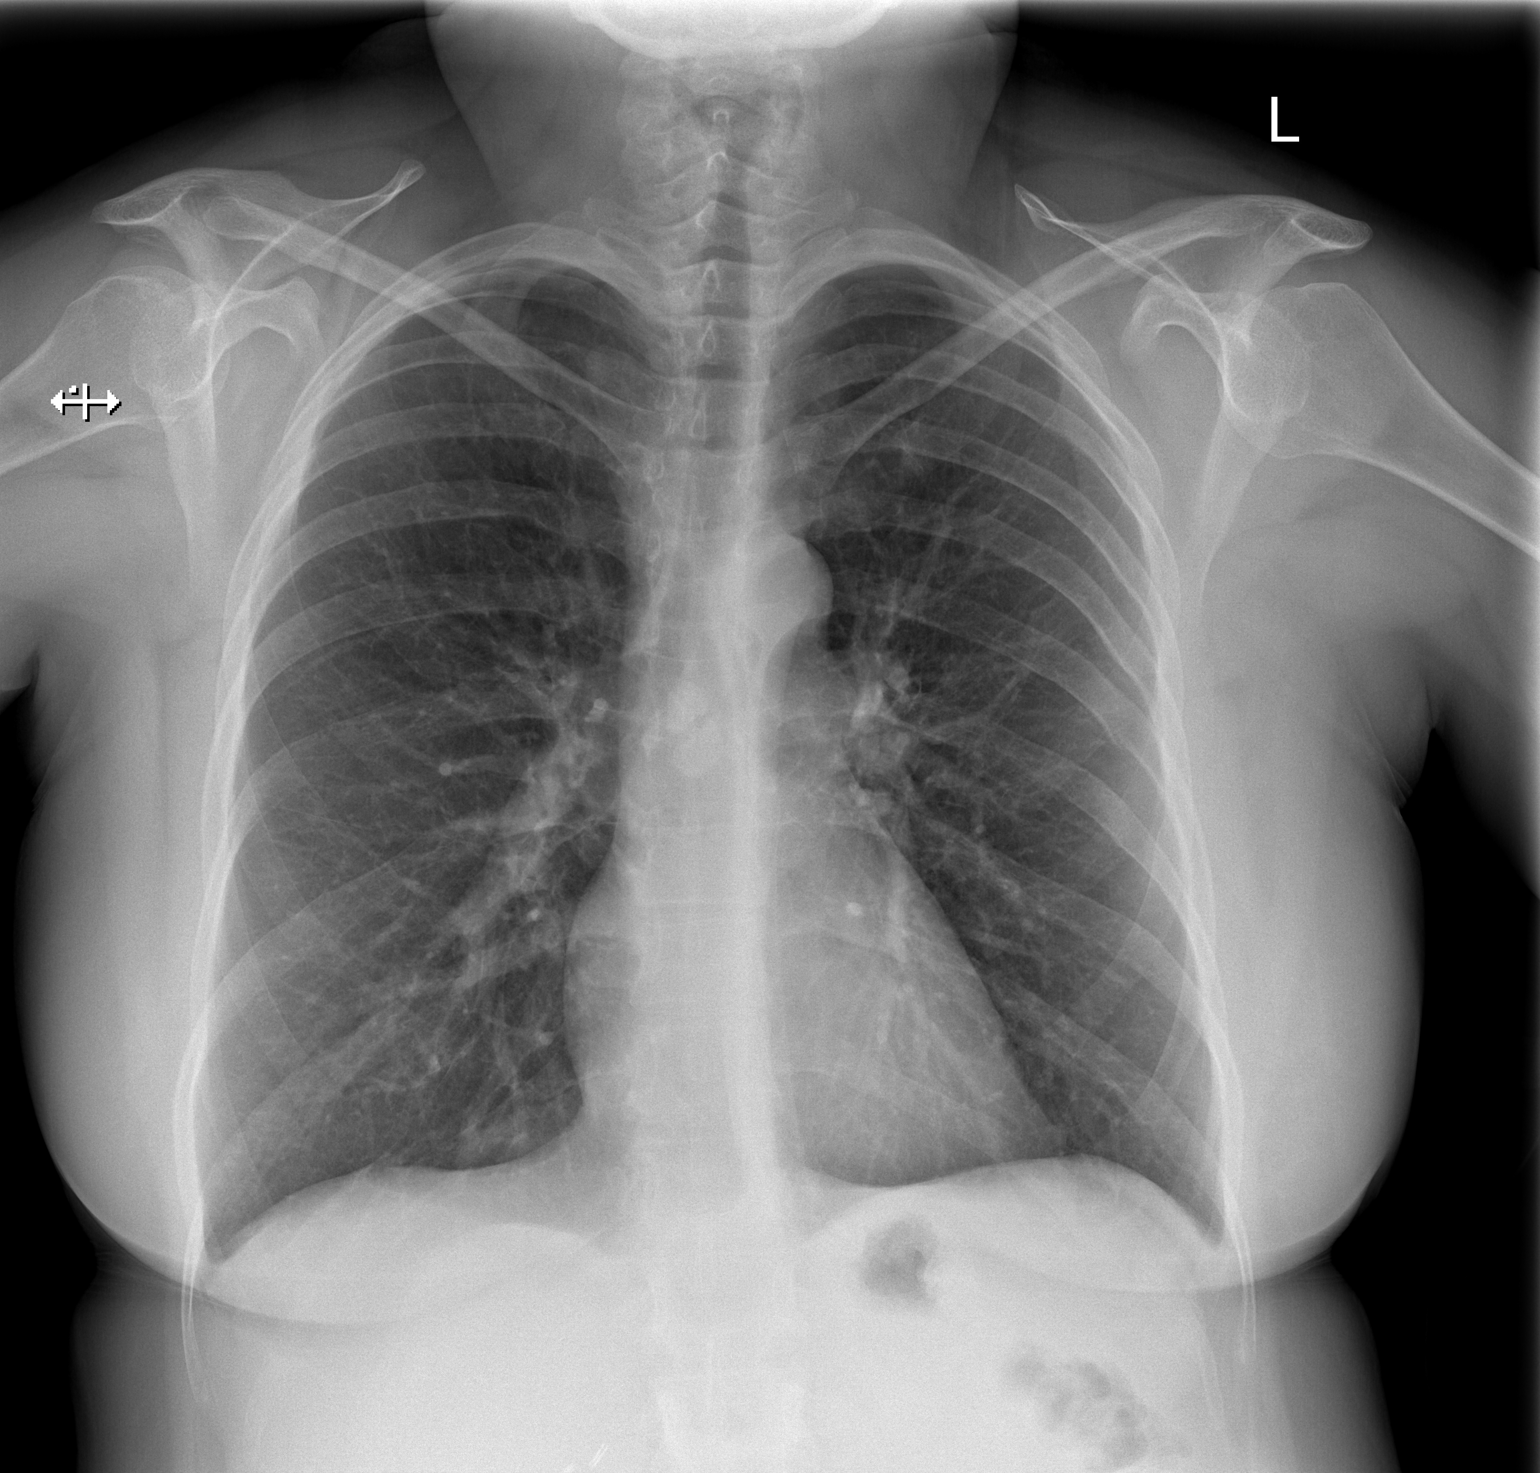

[w chest lat]
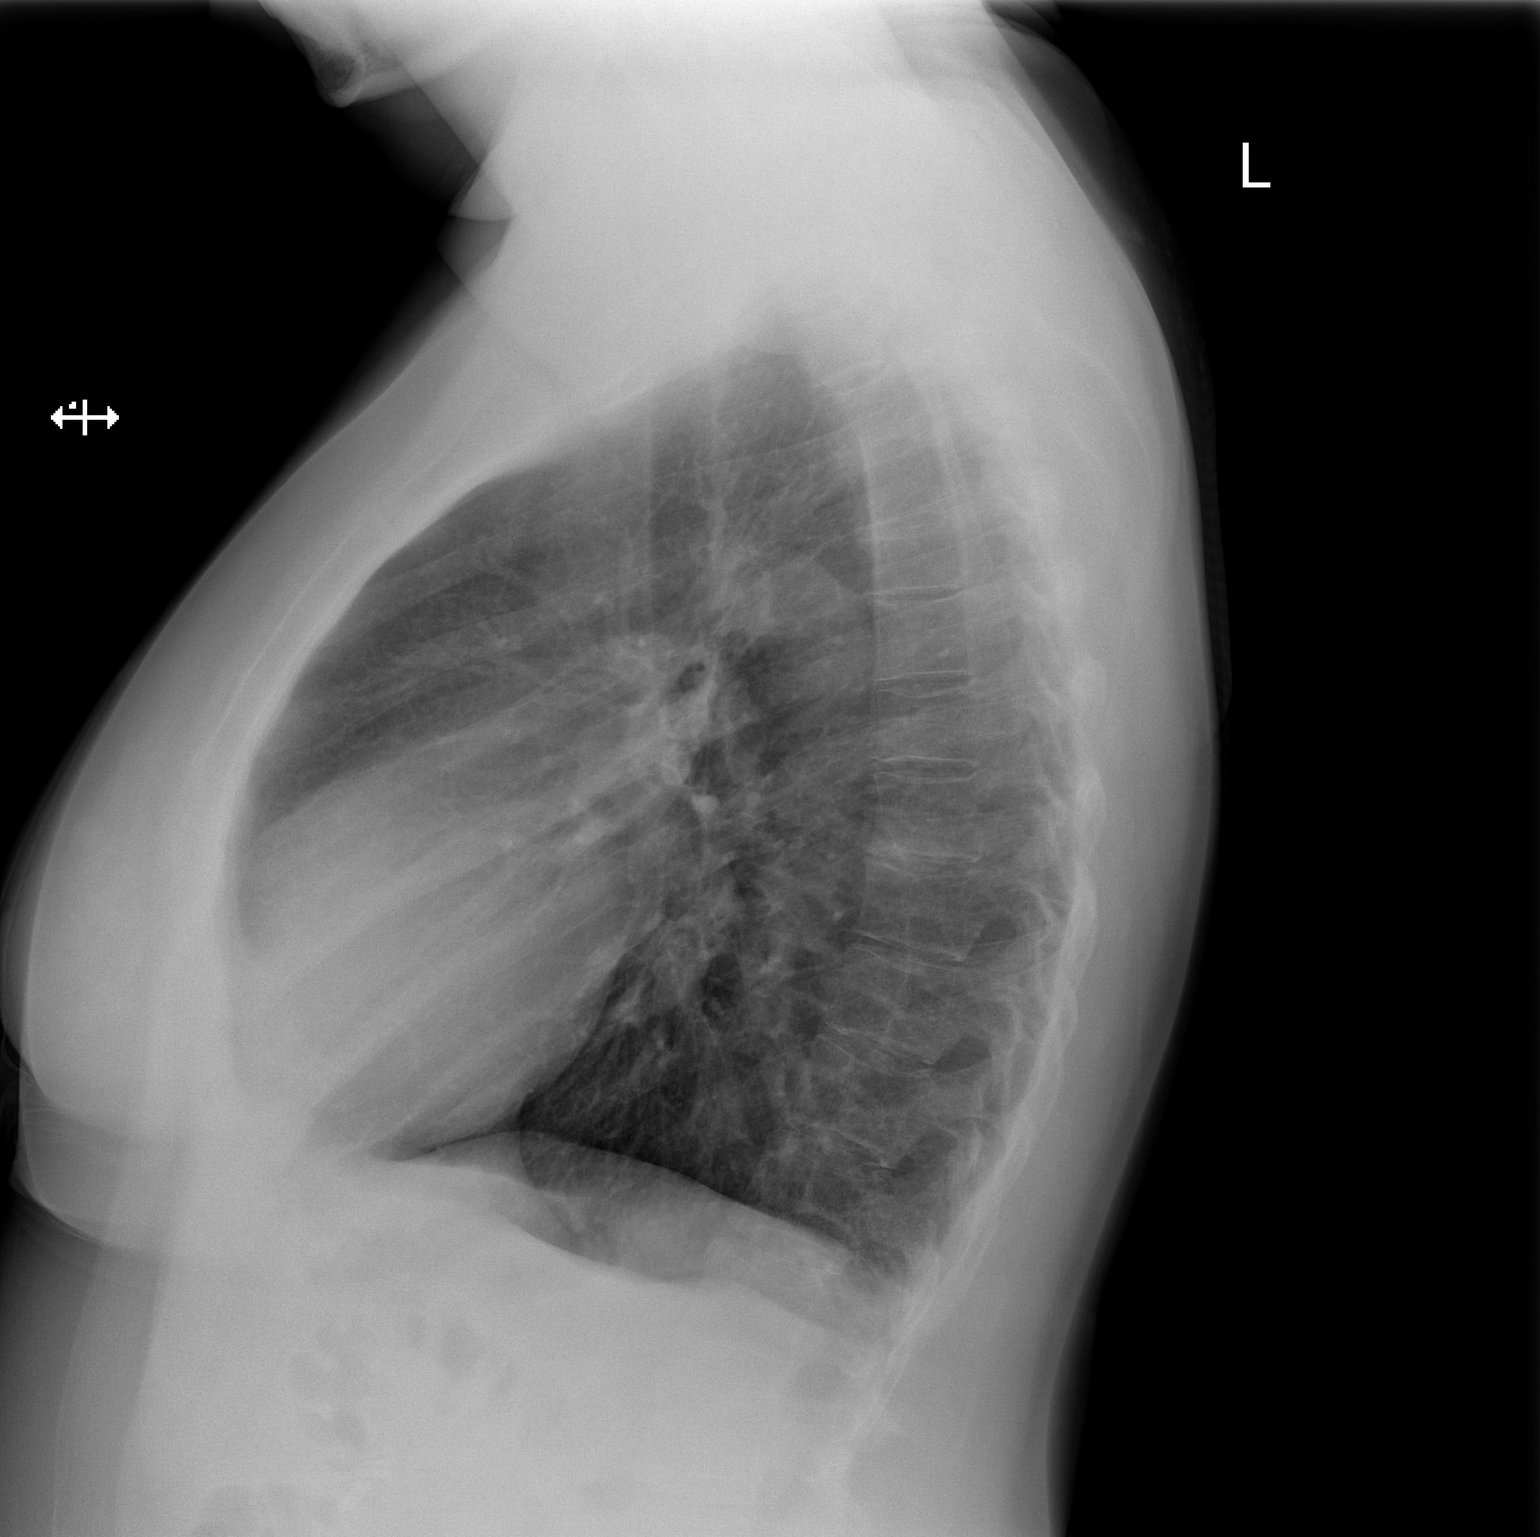

[2 of 2 positions shown; findings below may reference images not displayed]

FINDINGS: Borderline bronchial thickening and hyperinflation. No confluent
airspace disease. Heart size and mediastinal contours are normal.
Question of calcified subcarinal lymph nodes. No pleural effusion or
pneumothorax. Remote left rib fractures.
IMPRESSION: Borderline bronchial thickening and hyperinflation can be seen in
the setting of asthma.

## 2017-06-21 ENCOUNTER — Emergency Department: Payer: Medicaid Other

## 2017-06-21 ENCOUNTER — Encounter: Payer: Self-pay | Admitting: Emergency Medicine

## 2017-06-21 ENCOUNTER — Emergency Department
Admission: EM | Admit: 2017-06-21 | Discharge: 2017-06-21 | Discharge status: Against Medical Advice | Payer: Medicaid Other | Attending: Emergency Medicine | Admitting: Emergency Medicine

## 2017-06-21 DIAGNOSIS — R079 Chest pain, unspecified: Secondary | ICD-10-CM | POA: Insufficient documentation

## 2017-06-21 DIAGNOSIS — Z5321 Procedure and treatment not carried out due to patient leaving prior to being seen by health care provider: Secondary | ICD-10-CM | POA: Diagnosis not present

## 2017-06-21 LAB — CBC
HCT: 39.8 % (ref 35.0–47.0)
HEMOGLOBIN: 13.6 g/dL (ref 12.0–16.0)
MCH: 34.7 pg — ABNORMAL HIGH (ref 26.0–34.0)
MCHC: 34.2 g/dL (ref 32.0–36.0)
MCV: 101.3 fL — ABNORMAL HIGH (ref 80.0–100.0)
Platelets: 269 10*3/uL (ref 150–440)
RBC: 3.93 MIL/uL (ref 3.80–5.20)
RDW: 13 % (ref 11.5–14.5)
WBC: 6.7 10*3/uL (ref 3.6–11.0)

## 2017-06-21 LAB — BASIC METABOLIC PANEL
ANION GAP: 8 (ref 5–15)
BUN: 24 mg/dL — ABNORMAL HIGH (ref 6–20)
CALCIUM: 9.4 mg/dL (ref 8.9–10.3)
CO2: 22 mmol/L (ref 22–32)
CREATININE: 1.07 mg/dL — AB (ref 0.44–1.00)
Chloride: 110 mmol/L (ref 101–111)
GFR calc Af Amer: >60 mL/min (ref >60)
GLUCOSE: 83 mg/dL (ref 65–99)
Potassium: 3.8 mmol/L (ref 3.5–5.1)
Sodium: 140 mmol/L (ref 135–145)

## 2017-06-21 LAB — TROPONIN I

## 2017-06-21 NOTE — ED Notes (Signed)
Left after triage.

## 2017-06-21 NOTE — ED Triage Notes (Signed)
Pt here for chest pain across right and left chest that started today. Chest pain is intermittent, not made worse by anything. Denies any other associated sx with CP.  Reports fell on Friday, slid down 6 stairs. No LOC from fall. Bruise noted to left upper arm.  Chest pain is not from fall per pt and is different.  ambulatory to triage. Pain to left anterior ribs from fall without bruising present.

## 2017-08-18 IMAGING — US US TRANSVAGINAL NON-OB
1 series · 15 of 25 positions shown · non-contrast
Comparison: 08/26/2015 CT

CLINICAL DATA: Acute pelvic pain, fever.  Positive STD

EXAM:
TRANSABDOMINAL AND TRANSVAGINAL ULTRASOUND OF PELVIS
TECHNIQUE: Both transabdominal and transvaginal ultrasound examinations of the
pelvis were performed. Transabdominal technique was performed for
global imaging of the pelvis including uterus, ovaries, adnexal
regions, and pelvic cul-de-sac. It was necessary to proceed with
endovaginal exam following the transabdominal exam to visualize the
uterus and ovaries no.

[Series 1: us transvaginal non-ob · 15 of 54 slices shown]
[im 1/54]
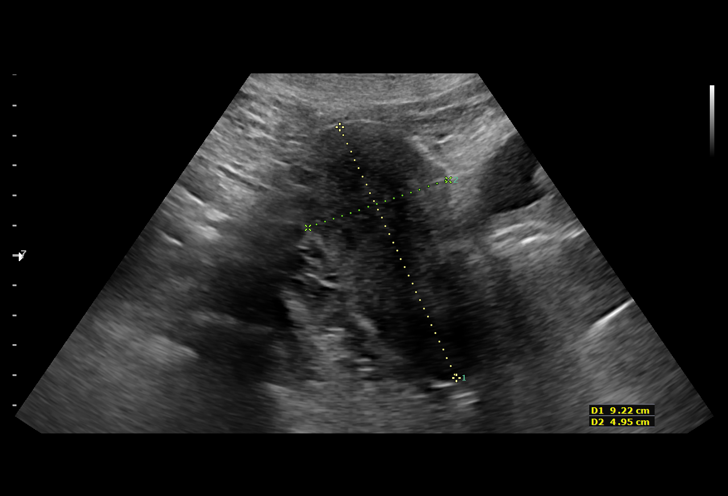
[im 5/54]
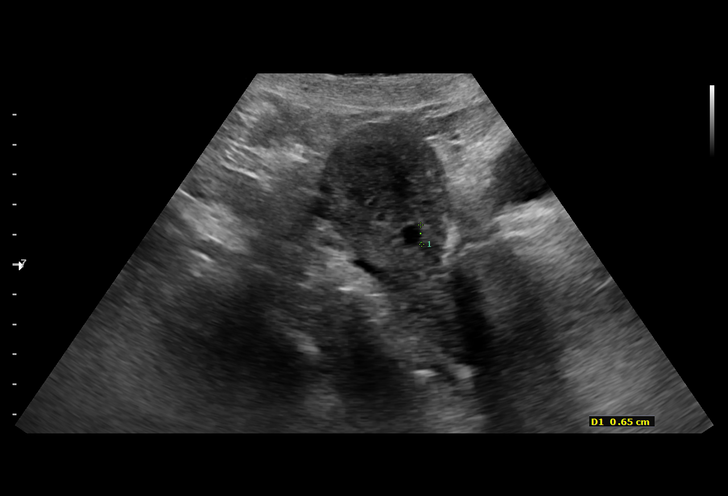
[im 9/54]
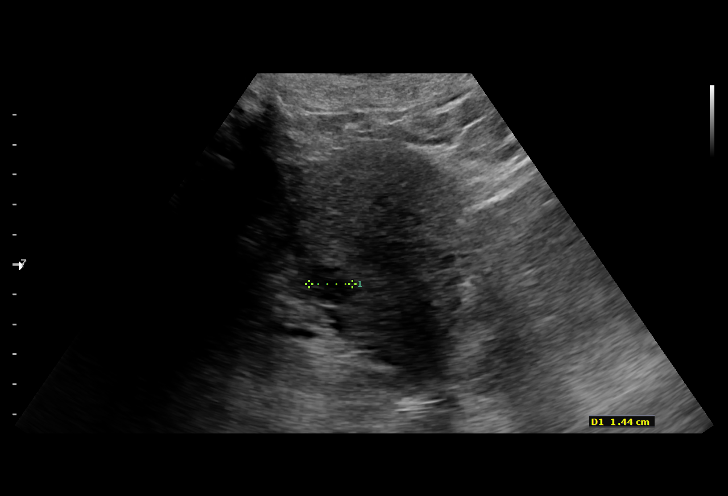
[im 12/54]
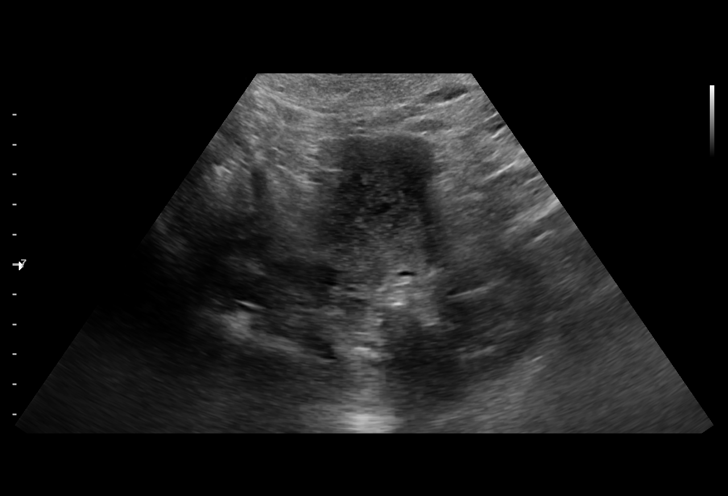
[im 16/54]
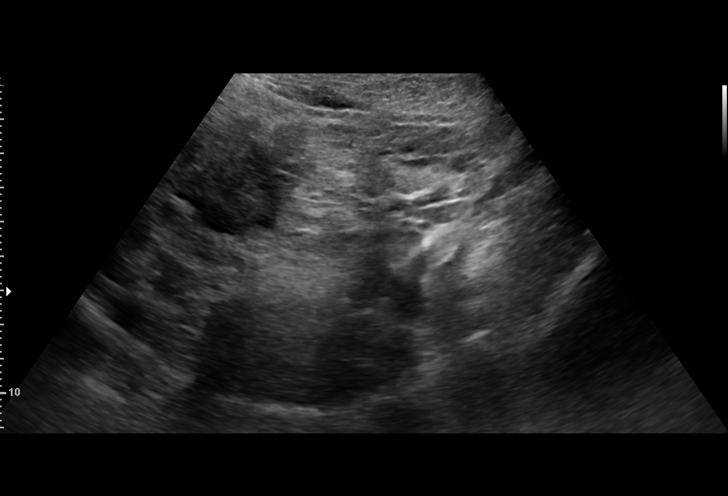
[im 20/54]
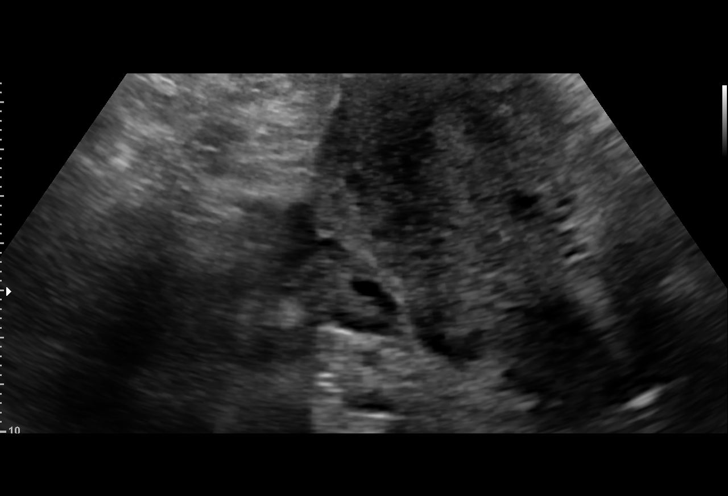
[im 23/54]
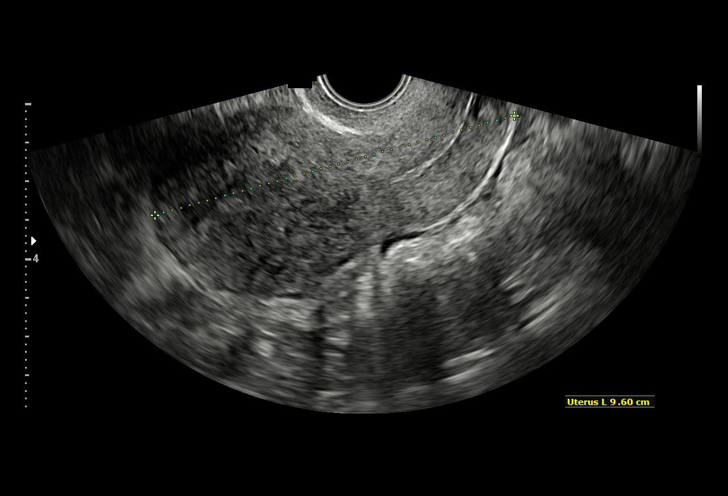
[im 27/54]
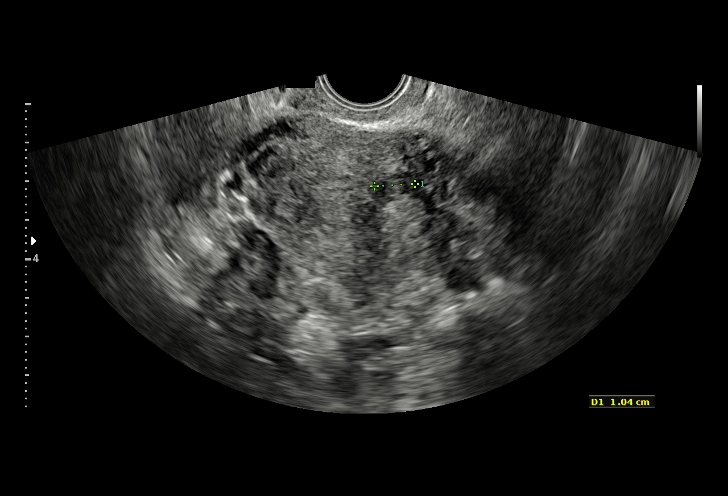
[im 31/54]
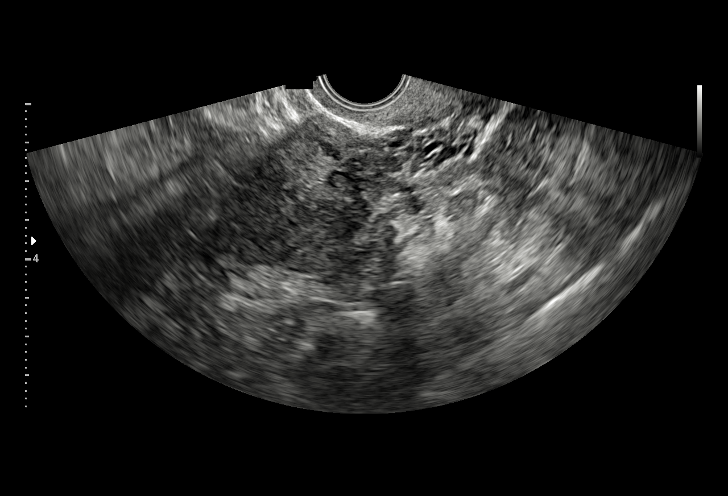
[im 34/54]
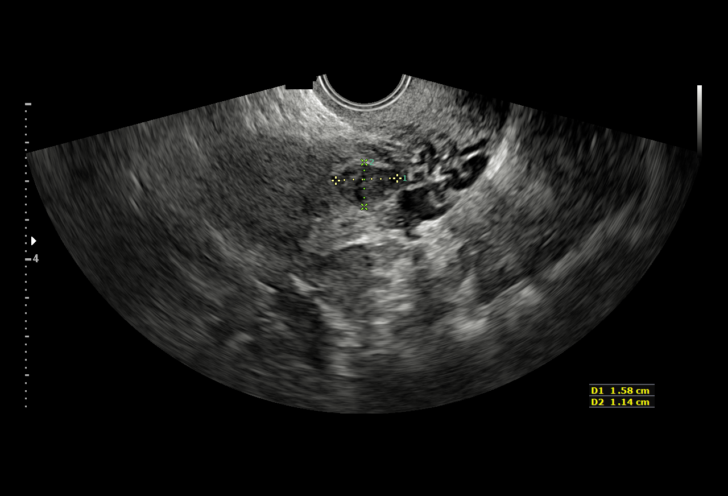
[im 38/54]
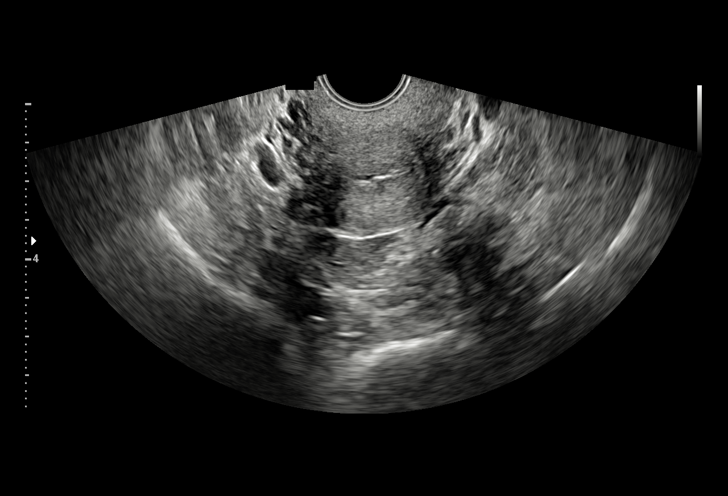
[im 42/54]
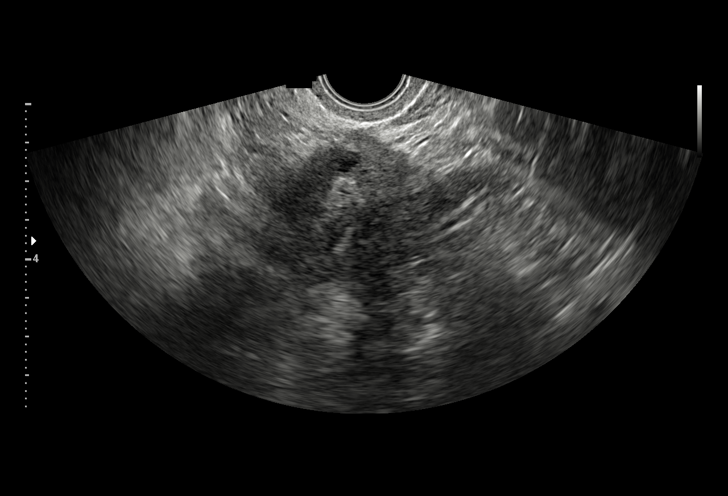
[im 45/54]
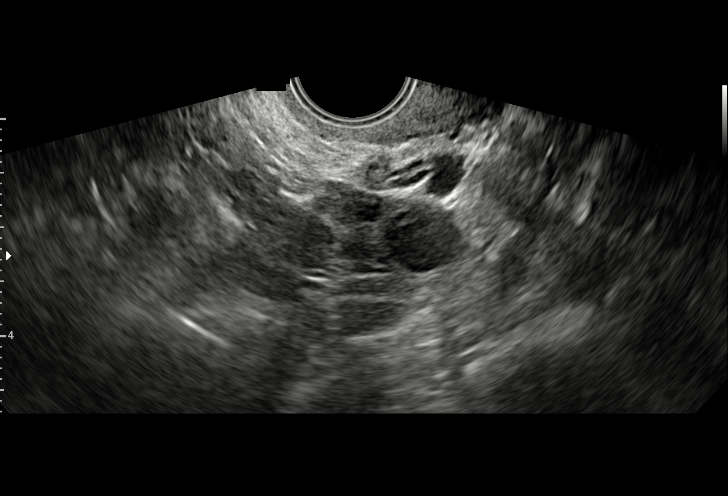
[im 49/54]
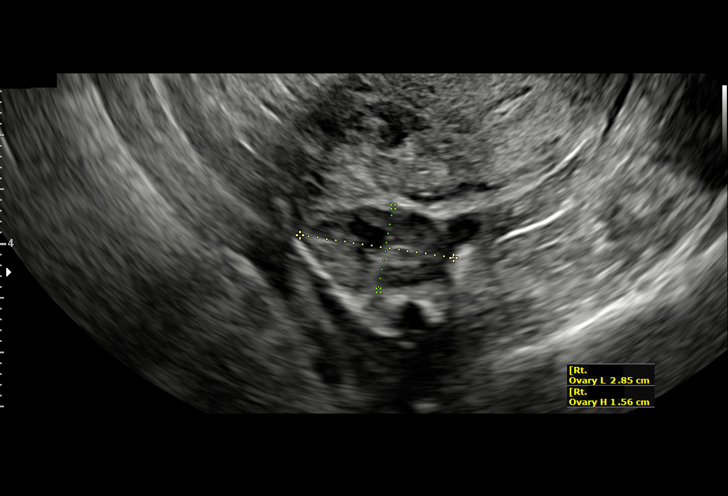
[im 54/54]
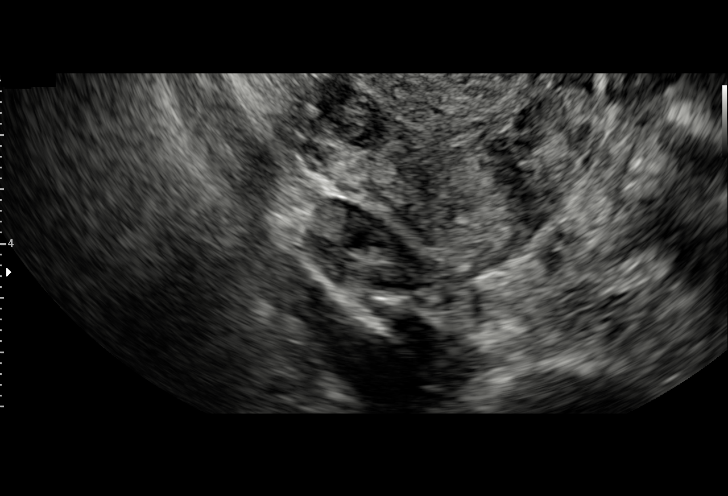

[15 of 25 positions shown; findings below may reference images not displayed]

FINDINGS: Uterus

Measurements: 9.6 x 4.8 x 5.8 cm.. Uterus is anteverted. There is a
slightly submucosal left-sided uterine body fibroid measuring 1.4 x
1 x 1 cm. There is a 0.9 x 0.8 x 0.9 cm intramural anterior fundal
fibroid. There is an intramural right-sided 0.9 x 0.8 x 0.8 cm
fibroid. There is an intramural right-sided 1.6 x 1.1 x 1.1 cm
fundal fibroid.

Endometrium

Thickness: 4.3 mm.  No focal abnormality visualized.

Right ovary

Measurements: 2.9 x 1.6 x 1.4 cm. Normal appearance/no adnexal mass.

Left ovary

Measurements: 2.6 x 1.6 x 1.8 cm. Normal appearance/no adnexal mass.

Other findings

No abnormal free fluid.
IMPRESSION: Anteverted uterus containing at least 4 leiomyomas, one slightly
submucosal on the left within the uterine body measuring 1.4 x 1 x 1
cm. Normal-appearing ovaries bilaterally.

## 2017-09-27 ENCOUNTER — Encounter: Payer: Self-pay | Admitting: Emergency Medicine

## 2017-09-27 ENCOUNTER — Emergency Department
Admission: EM | Admit: 2017-09-27 | Discharge: 2017-09-27 | Disposition: A | Discharge status: Home / Self Care | Payer: Medicaid Other | Attending: Emergency Medicine | Admitting: Emergency Medicine

## 2017-09-27 ENCOUNTER — Other Ambulatory Visit: Payer: Self-pay

## 2017-09-27 DIAGNOSIS — Z79899 Other long term (current) drug therapy: Secondary | ICD-10-CM | POA: Diagnosis not present

## 2017-09-27 DIAGNOSIS — A599 Trichomoniasis, unspecified: Secondary | ICD-10-CM | POA: Insufficient documentation

## 2017-09-27 DIAGNOSIS — J45909 Unspecified asthma, uncomplicated: Secondary | ICD-10-CM | POA: Diagnosis not present

## 2017-09-27 DIAGNOSIS — Z3202 Encounter for pregnancy test, result negative: Secondary | ICD-10-CM | POA: Insufficient documentation

## 2017-09-27 DIAGNOSIS — R102 Pelvic and perineal pain: Secondary | ICD-10-CM | POA: Diagnosis present

## 2017-09-27 LAB — CBC
HEMATOCRIT: 40.5 % (ref 35.0–47.0)
Hemoglobin: 14 g/dL (ref 12.0–16.0)
MCH: 35 pg — AB (ref 26.0–34.0)
MCHC: 34.5 g/dL (ref 32.0–36.0)
MCV: 101.2 fL — ABNORMAL HIGH (ref 80.0–100.0)
Platelets: 268 10*3/uL (ref 150–440)
RBC: 4.01 MIL/uL (ref 3.80–5.20)
RDW: 12.6 % (ref 11.5–14.5)
WBC: 6.9 10*3/uL (ref 3.6–11.0)

## 2017-09-27 LAB — COMPREHENSIVE METABOLIC PANEL
ALBUMIN: 4.2 g/dL (ref 3.5–5.0)
ALT: 16 U/L (ref 14–54)
AST: 25 U/L (ref 15–41)
Alkaline Phosphatase: 73 U/L (ref 38–126)
Anion gap: 7 (ref 5–15)
BILIRUBIN TOTAL: 0.6 mg/dL (ref 0.3–1.2)
BUN: 21 mg/dL — AB (ref 6–20)
CALCIUM: 9.3 mg/dL (ref 8.9–10.3)
CO2: 23 mmol/L (ref 22–32)
Chloride: 107 mmol/L (ref 101–111)
Creatinine, Ser: 0.86 mg/dL (ref 0.44–1.00)
GFR calc Af Amer: >60 mL/min (ref >60)
GFR calc non Af Amer: >60 mL/min (ref >60)
GLUCOSE: 101 mg/dL — AB (ref 65–99)
POTASSIUM: 3.8 mmol/L (ref 3.5–5.1)
Sodium: 137 mmol/L (ref 135–145)
TOTAL PROTEIN: 7.5 g/dL (ref 6.5–8.1)

## 2017-09-27 LAB — POCT PREGNANCY, URINE: PREG TEST UR: NEGATIVE

## 2017-09-27 LAB — URINALYSIS, COMPLETE (UACMP) WITH MICROSCOPIC
BACTERIA UA: NONE SEEN
Bilirubin Urine: NEGATIVE
Glucose, UA: NEGATIVE mg/dL
HGB URINE DIPSTICK: NEGATIVE
Ketones, ur: NEGATIVE mg/dL
Leukocytes, UA: NEGATIVE
NITRITE: NEGATIVE
PROTEIN: NEGATIVE mg/dL
Specific Gravity, Urine: 1.029 (ref 1.005–1.030)
pH: 5 (ref 5.0–8.0)

## 2017-09-27 LAB — WET PREP, GENITAL
Sperm: NONE SEEN
YEAST WET PREP: NONE SEEN

## 2017-09-27 LAB — CHLAMYDIA/NGC RT PCR (ARMC ONLY)
Chlamydia Tr: NOT DETECTED
N gonorrhoeae: NOT DETECTED

## 2017-09-27 LAB — LIPASE, BLOOD: Lipase: 44 U/L (ref 11–51)

## 2017-09-27 MED ORDER — METRONIDAZOLE 500 MG PO TABS: 500.0000 mg | ORAL_TABLET | Freq: Two times a day (BID) | ORAL | 0 refills | Status: DC

## 2017-09-27 NOTE — ED Triage Notes (Signed)
C/O lower abdominal and left lower back pain x 3 days.  C/O burning after urination.  Seen through [facility] hospital last year for similar symptoms and was diagnosed with trichomonas.  Patient states she is allergic to the antibiotic that was prescribed at the time and was told to douche with betadine instead.

## 2017-09-27 NOTE — ED Notes (Signed)
FN: pt amb to stat desk with reports of abd and back pain. NAD.

## 2017-09-27 NOTE — ED Provider Notes (Signed)
[facility] Emergency Department Provider Note   ____________________________________________    I have reviewed the triage vital signs and the nursing notes.   HISTORY  Chief Complaint Abdominal Pain and Back Pain     HPI Kelsey Maynard is a 42 y.o. female who presents with complaints of lower abdominal/pelvic cramping pain which she describes as moderate in nature.  She also describes some nausea occasionally.  Over the last several days she has had vaginal discharge which is white and smelly.  No new sexual partners.  No fevers or chills.  No diarrhea.  She reports a history of trichomonas in the past but because of the Flagyl allergy has had difficulty treating it.  Has not taken anything for her symptoms.  Nothing seems to make it better or worse   Past Medical History:  Diagnosis Date  . Asthma   . Trichimoniasis     There are no active problems to display for this patient.   Past Surgical History:  Procedure Laterality Date  . CHOLECYSTECTOMY    . HERNIA REPAIR      Prior to Admission medications   Medication Sig Start Date End Date Taking? Authorizing Provider  albuterol (PROVENTIL HFA;VENTOLIN HFA) 108 (90 Base) MCG/ACT inhaler Inhale 1-2 puffs into the lungs every 6 (six) hours as needed for wheezing or shortness of breath. 06/10/16   Sam, Olivia Canter, PA-C  butalbital-acetaminophen-caffeine [place], ESGIC) [phone] MG tablet Take 1-2 tablets by mouth every 6 (six) hours as needed for headache. 03/08/17 03/08/18  Darel Hong, MD  megestrol (MEGACE) 20 MG tablet Take 2 tablets (40 mg total) by mouth daily. 02/08/17   Leftwich-Kirby, Kathie Dike, CNM  megestrol (MEGACE) 20 MG tablet Take 2 tablets (40 mg total) by mouth 2 (two) times daily. 02/08/17   Leftwich-Kirby, Kathie Dike, CNM  metroNIDAZOLE (FLAGYL) 500 MG tablet Take 1 tablet (500 mg total) by mouth 2 (two) times daily after a meal. 09/27/17   Lavonia Drafts, MD  naproxen (NAPROSYN) 500 MG  tablet Take 1 tablet (500 mg total) by mouth 2 (two) times daily with a meal. Patient taking differently: Take 500 mg by mouth 2 (two) times daily as needed for moderate pain.  11/04/16   Julianne Handler, CNM     Allergies Prednisone; Bactrim [sulfamethoxazole-trimethoprim]; Flagyl [metronidazole]; Morphine and related; Vicodin [hydrocodone-acetaminophen]; and Zofran [ondansetron hcl]  No family history on file.  Social History Social History   Tobacco Use  . Smoking status: Never Smoker  . Smokeless tobacco: Never Used  Substance Use Topics  . Alcohol use: No  . Drug use: No    Review of Systems  Constitutional: No fever/chills Eyes: No visual changes.  ENT: No sore throat. Cardiovascular: Denies chest pain. Respiratory: Denies shortness of breath. Gastrointestinal: As above Genitourinary: Negative for dysuria. Musculoskeletal: Negative for back pain. Skin: Negative for rash. Neurological: Negative for headaches    ____________________________________________   PHYSICAL EXAM:  VITAL SIGNS: ED Triage Vitals  Enc Vitals Group     BP 09/27/17 1144 112/71     Pulse Rate 09/27/17 1144 84     Resp 09/27/17 1144 16     Temp 09/27/17 1144 98.7 F (37.1 C)     Temp Source 09/27/17 1144 Oral     SpO2 09/27/17 1144 98 %     Weight 09/27/17 1142 83.9 kg (185 lb)     Height 09/27/17 1142 1.727 m (5\' 8" )     Head Circumference --  Peak Flow --      Pain Score 09/27/17 1142 10     Pain Loc --      Pain Edu? --      Excl. in [place]? --     Constitutional: Alert and oriented. No acute distress. Pleasant and interactive Eyes: Conjunctivae are normal.   Nose: No congestion/rhinnorhea. Mouth/Throat: Mucous membranes are moist.    Cardiovascular: Normal rate, regular rhythm.   Good peripheral circulation. Respiratory: Normal respiratory effort.  No retractions. Gastrointestinal: Soft and nontender. No distention.  No CVA tenderness. Genitourinary: No cervicitis, no CMT,  white fishy smelling discharge Musculoskeletal: Warm and well perfused Neurologic:  Normal speech and language. No gross focal neurologic deficits are appreciated.  Skin:  Skin is warm, dry and intact. No rash noted. Psychiatric: Mood and affect are normal. Speech and behavior are normal.  ____________________________________________   LABS (all labs ordered are listed, but only abnormal results are displayed)  Labs Reviewed  WET PREP, GENITAL - Abnormal; Notable for the following components:      Result Value   Trich, Wet Prep PRESENT (*)    Clue Cells Wet Prep HPF POC PRESENT (*)    WBC, Wet Prep HPF POC MODERATE (*)    All other components within normal limits  COMPREHENSIVE METABOLIC PANEL - Abnormal; Notable for the following components:   Glucose, Bld 101 (*)    BUN 21 (*)    All other components within normal limits  CBC - Abnormal; Notable for the following components:   MCV 101.2 (*)    MCH 35.0 (*)    All other components within normal limits  URINALYSIS, COMPLETE (UACMP) WITH MICROSCOPIC - Abnormal; Notable for the following components:   Color, Urine YELLOW (*)    APPearance CLOUDY (*)    Squamous Epithelial / LPF 6-30 (*)    All other components within normal limits  CHLAMYDIA/NGC RT PCR ([facility] ONLY)  LIPASE, BLOOD  POC URINE PREG, ED  POCT PREGNANCY, URINE   ____________________________________________  EKG  None ____________________________________________  RADIOLOGY  None ____________________________________________   PROCEDURES  Procedure(s) performed: No  Procedures   Critical Care performed: No ____________________________________________   INITIAL IMPRESSION / ASSESSMENT AND PLAN / ED COURSE  Pertinent labs & imaging results that were available during my care of the patient were reviewed by me and considered in my medical decision making (see chart for details).  Patient with lower abdominal pain with vaginal discharge over  the last several days.  Pelvic exam demonstrates no CMT, white discharge as detailed above.  Wet prep is consistent with trichomonas, clue cells as well.  Discussed with patient treatment options.  She reports the last time she took Flagyl was in 1996 and she was pregnant and had a mild rash.  No difficulty breathing or concerning anaphylactic reaction.  I recommended that she undergo sensitization or discussed with her that she could try it again and potentially pretreat with Benadryl, although I noted that that could still precipitate a serious allergic reaction.  Patient understands this but would like to try Flagyl again given her frustration at having this recurrent trichomonas    ____________________________________________   FINAL CLINICAL IMPRESSION(S) / ED DIAGNOSES  Final diagnoses:  Trichomoniasis        Note:  This document was prepared using Dragon voice recognition software and may include unintentional dictation errors.    Lavonia Drafts, MD 09/27/17 [phone]

## 2018-01-13 ENCOUNTER — Emergency Department
Admission: EM | Admit: 2018-01-13 | Discharge: 2018-01-13 | Disposition: A | Discharge status: Home / Self Care | Payer: Medicaid Other | Attending: Emergency Medicine | Admitting: Emergency Medicine

## 2018-01-13 ENCOUNTER — Other Ambulatory Visit: Payer: Self-pay

## 2018-01-13 DIAGNOSIS — Z79899 Other long term (current) drug therapy: Secondary | ICD-10-CM | POA: Insufficient documentation

## 2018-01-13 DIAGNOSIS — J45909 Unspecified asthma, uncomplicated: Secondary | ICD-10-CM | POA: Insufficient documentation

## 2018-01-13 DIAGNOSIS — D1722 Benign lipomatous neoplasm of skin and subcutaneous tissue of left arm: Secondary | ICD-10-CM | POA: Insufficient documentation

## 2018-01-13 MED ORDER — NAPROXEN 500 MG PO TABS: 500.0000 mg | ORAL_TABLET | Freq: Two times a day (BID) | ORAL | 0 refills | Status: DC

## 2018-01-13 NOTE — Discharge Instructions (Addendum)
Call make an appointment with the surgeon listed on your discharge papers.  Begin taking naproxen 500 mg twice daily with food.  Lipomas are fatty tumors.  They are benign, meaning that they are not cancerous.  Naproxen should help with the inflammation.

## 2018-01-13 NOTE — ED Triage Notes (Signed)
Pt states she has had bump on L arm since middle school but states that now it is painful and that's why she is here. Denies injury. Alert, oriented, ambulatory.

## 2018-01-13 NOTE — ED Provider Notes (Signed)
[facility] Emergency Department Provider Note  ____________________________________________   First MD Initiated Contact with Patient 01/13/18 [phone]     (approximate)  I have reviewed the triage vital signs and the nursing notes.   HISTORY  Chief Complaint Arm Pain   HPI Kelsey Maynard is a 42 y.o. female is here with complaint of a bump to her left forearm since she was approximately 42 years old.  She states that today it is painful.  She has been seen in the past when she was a child and told that this was a lipoma.  She denies any recent injury to the area or any changes.  She still continues to use her left arm without any difficulties.  She rates her pain as a 10/10.   Past Medical History:  Diagnosis Date  . Asthma   . Trichimoniasis     There are no active problems to display for this patient.   Past Surgical History:  Procedure Laterality Date  . CHOLECYSTECTOMY    . HERNIA REPAIR      Prior to Admission medications   Medication Sig Start Date End Date Taking? Authorizing Provider  albuterol (PROVENTIL HFA;VENTOLIN HFA) 108 (90 Base) MCG/ACT inhaler Inhale 1-2 puffs into the lungs every 6 (six) hours as needed for wheezing or shortness of breath. 06/10/16   Sam, Olivia Canter, PA-C  butalbital-acetaminophen-caffeine [place], ESGIC) [phone] MG tablet Take 1-2 tablets by mouth every 6 (six) hours as needed for headache. 03/08/17 03/08/18  Darel Hong, MD  megestrol (MEGACE) 20 MG tablet Take 2 tablets (40 mg total) by mouth daily. 02/08/17   Leftwich-Kirby, Kathie Dike, CNM  megestrol (MEGACE) 20 MG tablet Take 2 tablets (40 mg total) by mouth 2 (two) times daily. 02/08/17   Leftwich-Kirby, Kathie Dike, CNM  metroNIDAZOLE (FLAGYL) 500 MG tablet Take 1 tablet (500 mg total) by mouth 2 (two) times daily after a meal. 09/27/17   Lavonia Drafts, MD  naproxen (NAPROSYN) 500 MG tablet Take 1 tablet (500 mg total) by mouth 2 (two) times daily with a meal.  01/13/18   Johnn Hai, PA-C    Allergies Prednisone; Bactrim [sulfamethoxazole-trimethoprim]; Flagyl [metronidazole]; Morphine and related; Vicodin [hydrocodone-acetaminophen]; and Zofran [ondansetron hcl]  History reviewed. No pertinent family history.  Social History Social History   Tobacco Use  . Smoking status: Never Smoker  . Smokeless tobacco: Never Used  Substance Use Topics  . Alcohol use: No  . Drug use: No    Review of Systems Constitutional: No fever/chills Cardiovascular: Denies chest pain. Respiratory: Denies shortness of breath. Gastrointestinal:  No nausea, no vomiting. Musculoskeletal: Negative for back pain. Skin: Positive for "bump" left arm. Neurological: Negative for  focal weakness or numbness. ____________________________________________   PHYSICAL EXAM:  VITAL SIGNS: ED Triage Vitals  Enc Vitals Group     BP 01/13/18 0719 95/82     Pulse Rate 01/13/18 0719 71     Resp 01/13/18 0719 16     Temp 01/13/18 0719 98 F (36.7 C)     Temp Source 01/13/18 0719 Oral     SpO2 01/13/18 0719 99 %     Weight --      Height 01/13/18 0720 5\' 8"  (1.727 m)     Head Circumference --      Peak Flow --      Pain Score 01/13/18 0720 10     Pain Loc --      Pain Edu? --  Excl. in [place]? --    Constitutional: Alert and oriented. Well appearing and in no acute distress. Eyes: Conjunctivae are normal.  Head: Atraumatic. Neck: No stridor.   Cardiovascular: Normal rate, regular rhythm. Grossly normal heart sounds.  Good peripheral circulation. Respiratory: Normal respiratory effort.  No retractions. Lungs CTAB. Musculoskeletal: Examination of left arm there is no gross deformity no difficulty with range of motion. Neurologic:  Normal speech and language. No gross focal neurologic deficits are appreciated.  Skin:  Skin is warm, dry and intact.  Left forearm with round mobile nodule without erythema, ecchymosis or warmth.  Area is nontender to touch.  This  is consistent with a lipoma. Psychiatric: Mood and affect are normal. Speech and behavior are normal.  ____________________________________________   LABS (all labs ordered are listed, but only abnormal results are displayed)  Labs Reviewed - No data to display   PROCEDURES  Procedure(s) performed: None  Procedures  Critical Care performed: No  ____________________________________________   INITIAL IMPRESSION / ASSESSMENT AND PLAN / ED COURSE  As part of my medical decision making, I reviewed the following data within the electronic MEDICAL RECORD NUMBER Notes from prior ED visits and [place] Controlled Substance Database  Patient was made aware that this most likely is a lipoma as she has been told in the past.  Patient was given a prescription for naproxen 500 mg twice daily with food.  She is also to follow-up with a PCP of choice or [facility] if any continued problems.  She was also given the name of a surgeon should she want to have this completely removed.  ____________________________________________   FINAL CLINICAL IMPRESSION(S) / ED DIAGNOSES  Final diagnoses:  Lipoma of left forearm     ED Discharge Orders        Ordered    naproxen (NAPROSYN) 500 MG tablet  2 times daily with meals     01/13/18 0817       Note:  This document was prepared using Dragon voice recognition software and may include unintentional dictation errors.    Johnn Hai, PA-C 01/13/18 1537    Carrie Mew, MD 01/14/18 1539

## 2018-01-13 NOTE — ED Notes (Signed)
Pt reports lump to left forearm since middle school that has recently started causing her pain. Pt denies injuries or other sx's

## 2018-01-13 NOTE — ED Notes (Signed)
Pt verbalized understanding of discharge instructions. NAD at this time. 

## 2018-01-17 ENCOUNTER — Telehealth: Payer: Self-pay | Admitting: Surgery

## 2018-01-17 NOTE — Telephone Encounter (Signed)
Unable to leave a message on patients voicemail to call the office. °

## 2018-01-17 NOTE — Telephone Encounter (Signed)
-----   Message from Mickie Kay sent at 01/17/2018  1:05 PM EDT ----- Regarding: ED referral-New patient Per [facility], when in office 2-3 weeks. Lipoma of left forearm

## 2018-01-31 NOTE — Telephone Encounter (Signed)
Unable to leave a message for the patient to call the office. °

## 2018-02-07 NOTE — Telephone Encounter (Signed)
Unable to leave a message for the patient to call the office, I have also mailed a letter for the patient to contact our office.

## 2018-02-23 ENCOUNTER — Emergency Department: Payer: Medicaid Other

## 2018-02-23 ENCOUNTER — Other Ambulatory Visit: Payer: Self-pay

## 2018-02-23 ENCOUNTER — Encounter: Payer: Self-pay | Admitting: Emergency Medicine

## 2018-02-23 ENCOUNTER — Emergency Department
Admission: EM | Admit: 2018-02-23 | Discharge: 2018-02-23 | Disposition: A | Discharge status: Home / Self Care | Payer: Medicaid Other | Attending: Emergency Medicine | Admitting: Emergency Medicine

## 2018-02-23 DIAGNOSIS — M19071 Primary osteoarthritis, right ankle and foot: Secondary | ICD-10-CM | POA: Insufficient documentation

## 2018-02-23 DIAGNOSIS — J45909 Unspecified asthma, uncomplicated: Secondary | ICD-10-CM | POA: Insufficient documentation

## 2018-02-23 DIAGNOSIS — Z79899 Other long term (current) drug therapy: Secondary | ICD-10-CM | POA: Insufficient documentation

## 2018-02-23 MED ORDER — MELOXICAM 15 MG PO TABS: 15.0000 mg | ORAL_TABLET | Freq: Every day | ORAL | 0 refills | Status: DC

## 2018-02-23 MED ORDER — TRAMADOL HCL 50 MG PO TABS: 50.0000 mg | ORAL_TABLET | Freq: Four times a day (QID) | ORAL | 0 refills | Status: DC | PRN

## 2018-02-23 NOTE — ED Provider Notes (Signed)
[facility] Emergency Department Provider Note ____________________________________________  Time seen: Approximately 4:20 PM  I have reviewed the triage vital signs and the nursing notes.   HISTORY  Chief Complaint Foot Pain    HPI Kelsey Maynard is a 42 y.o. female who presents to the emergency department for evaluation and treatment of foot and ankle pain. No known injury.She states she works 8 hour shifts and is on her feet most of that time. No previous foot/ankle pain or injury. No relief with aleve or ibuprofen.  Past Medical History:  Diagnosis Date  . Asthma   . Trichimoniasis     There are no active problems to display for this patient.   Past Surgical History:  Procedure Laterality Date  . CHOLECYSTECTOMY    . HERNIA REPAIR      Prior to Admission medications   Medication Sig Start Date End Date Taking? Authorizing Provider  albuterol (PROVENTIL HFA;VENTOLIN HFA) 108 (90 Base) MCG/ACT inhaler Inhale 1-2 puffs into the lungs every 6 (six) hours as needed for wheezing or shortness of breath. 06/10/16   Sam, Olivia Canter, PA-C  butalbital-acetaminophen-caffeine [place], ESGIC) [phone] MG tablet Take 1-2 tablets by mouth every 6 (six) hours as needed for headache. 03/08/17 03/08/18  Darel Hong, MD  megestrol (MEGACE) 20 MG tablet Take 2 tablets (40 mg total) by mouth daily. 02/08/17   Leftwich-Kirby, Kathie Dike, CNM  megestrol (MEGACE) 20 MG tablet Take 2 tablets (40 mg total) by mouth 2 (two) times daily. 02/08/17   Leftwich-Kirby, Kathie Dike, CNM  meloxicam (MOBIC) 15 MG tablet Take 1 tablet (15 mg total) by mouth daily. 02/23/18   Shaunessy Dobratz, Dessa Phi, FNP  metroNIDAZOLE (FLAGYL) 500 MG tablet Take 1 tablet (500 mg total) by mouth 2 (two) times daily after a meal. 09/27/17   Lavonia Drafts, MD  naproxen (NAPROSYN) 500 MG tablet Take 1 tablet (500 mg total) by mouth 2 (two) times daily with a meal. 01/13/18   Johnn Hai, PA-C  traMADol (ULTRAM) 50  MG tablet Take 1 tablet (50 mg total) by mouth every 6 (six) hours as needed. 02/23/18   Victorino Dike, FNP    Allergies Prednisone; Bactrim [sulfamethoxazole-trimethoprim]; Flagyl [metronidazole]; Morphine and related; Vicodin [hydrocodone-acetaminophen]; and Zofran [ondansetron hcl]  No family history on file.  Social History Social History   Tobacco Use  . Smoking status: Never Smoker  . Smokeless tobacco: Never Used  Substance Use Topics  . Alcohol use: No  . Drug use: No    Review of Systems Constitutional: Negative for fever. Cardiovascular: Negative for chest pain. Respiratory: Negative for shortness of breath. Musculoskeletal: Positive for right ankle and foot pain. Skin: Negative for lesion or wound.  Neurological: Negative for decrease in sensation  ____________________________________________   PHYSICAL EXAM:  VITAL SIGNS: ED Triage Vitals  Enc Vitals Group     BP 02/23/18 1604 116/68     Pulse Rate 02/23/18 1604 85     Resp 02/23/18 1604 18     Temp 02/23/18 1604 99.8 F (37.7 C)     Temp Source 02/23/18 1604 Oral     SpO2 02/23/18 1604 95 %     Weight 02/23/18 1604 185 lb (83.9 kg)     Height --      Head Circumference --      Peak Flow --      Pain Score 02/23/18 1603 6     Pain Loc --      Pain Edu? --  Excl. in [place]? --     Constitutional: Alert and oriented. Well appearing and in no acute distress. Eyes: Conjunctivae are clear without discharge or drainage Head: Atraumatic Neck: Supple Respiratory: No cough. Respirations are even and unlabored. Musculoskeletal: Right foot tender on dorsal midfoot and lateral aspect of foot without focal tenderness or deformity. Neurologic: Motor and sensation of the right foot and ankle is intact.  Skin: No wound or lesion on right ankle or foot.  Psychiatric: Affect and behavior are appropriate.  ____________________________________________   LABS (all labs ordered are listed, but only abnormal  results are displayed)  Labs Reviewed - No data to display ____________________________________________  RADIOLOGY  Image of the right foot reveals no fracture or acute bony abnormality.  There is first metatarsophalangeal joint osteoarthritis and a small plantar calcaneal spur. ____________________________________________   PROCEDURES  Procedures  ____________________________________________   INITIAL IMPRESSION / ASSESSMENT AND PLAN / ED COURSE  Kelsey Maynard is a 42 y.o. who presents to the emergency department for treatment and evaluation of right foot pain without injury.  Osteoarthritis of the great toe was revealed on imaging.  She was placed in Ace wrap and postop shoe.  She was even a prescription for meloxicam and will follow-up with podiatry for symptoms that are not improving over the week or so.  She was encouraged to return to the emergency department for symptoms of change or worsen if she is unable to schedule an appointment with primary care with specialist.  Medications - No data to display  Pertinent labs & imaging results that were available during my care of the patient were reviewed by me and considered in my medical decision making (see chart for details).  _________________________________________   FINAL CLINICAL IMPRESSION(S) / ED DIAGNOSES  Final diagnoses:  Osteoarthritis of right foot, unspecified osteoarthritis type    ED Discharge Orders        Ordered    traMADol (ULTRAM) 50 MG tablet  Every 6 hours PRN     02/23/18 1722    meloxicam (MOBIC) 15 MG tablet  Daily     02/23/18 1722       If controlled substance prescribed during this visit, 12 month history viewed on the [place] prior to issuing an initial prescription for Schedule II or III opiod.    Victorino Dike, FNP 02/23/18 2146    Rudene Re, MD 02/25/18 1606

## 2018-02-23 NOTE — ED Triage Notes (Signed)
Arrives with C/O right foot / ankle pain.  Denies injury.

## 2018-03-13 ENCOUNTER — Emergency Department: Payer: Self-pay

## 2018-03-13 ENCOUNTER — Emergency Department
Admission: EM | Admit: 2018-03-13 | Discharge: 2018-03-13 | Disposition: A | Discharge status: Home / Self Care | Payer: Self-pay | Attending: Emergency Medicine | Admitting: Emergency Medicine

## 2018-03-13 ENCOUNTER — Other Ambulatory Visit: Payer: Self-pay

## 2018-03-13 ENCOUNTER — Encounter: Payer: Self-pay | Admitting: Emergency Medicine

## 2018-03-13 DIAGNOSIS — X509XXA Other and unspecified overexertion or strenuous movements or postures, initial encounter: Secondary | ICD-10-CM | POA: Insufficient documentation

## 2018-03-13 DIAGNOSIS — M7582 Other shoulder lesions, left shoulder: Secondary | ICD-10-CM

## 2018-03-13 DIAGNOSIS — Z9049 Acquired absence of other specified parts of digestive tract: Secondary | ICD-10-CM | POA: Insufficient documentation

## 2018-03-13 DIAGNOSIS — S46012A Strain of muscle(s) and tendon(s) of the rotator cuff of left shoulder, initial encounter: Secondary | ICD-10-CM | POA: Insufficient documentation

## 2018-03-13 DIAGNOSIS — Y9372 Activity, wrestling: Secondary | ICD-10-CM | POA: Insufficient documentation

## 2018-03-13 DIAGNOSIS — Y998 Other external cause status: Secondary | ICD-10-CM | POA: Insufficient documentation

## 2018-03-13 DIAGNOSIS — Z79899 Other long term (current) drug therapy: Secondary | ICD-10-CM | POA: Insufficient documentation

## 2018-03-13 DIAGNOSIS — J45909 Unspecified asthma, uncomplicated: Secondary | ICD-10-CM | POA: Insufficient documentation

## 2018-03-13 DIAGNOSIS — Y929 Unspecified place or not applicable: Secondary | ICD-10-CM | POA: Insufficient documentation

## 2018-03-13 MED ORDER — MELOXICAM 15 MG PO TABS: 15.0000 mg | ORAL_TABLET | Freq: Every day | ORAL | 1 refills | Status: AC

## 2018-03-13 NOTE — ED Provider Notes (Signed)
[facility] Emergency Department Provider Note  ____________________________________________  Time seen: Approximately 5:32 PM  I have reviewed the triage vital signs and the nursing notes.   HISTORY  Chief Complaint Arm Pain    HPI Kelsey Maynard is a 42 y.o. female presents to the emergency department with 7 out of 10 aching and throbbing left lateral shoulder pain that occurred after patient was wrestling with her kids.  Patient reports that she felt a "pop".  Since that time, patient has felt a locking and catching sensation when she attempts  abduction to 90 degrees.  Patient reports that she has difficulty unsnapping her bra strap.  She denies reproducible pain with range of motion of the neck.  Patient denies prior left shoulder injuries.  No alleviating measures have been attempted.   Past Medical History:  Diagnosis Date  . Asthma   . Trichimoniasis     There are no active problems to display for this patient.   Past Surgical History:  Procedure Laterality Date  . CHOLECYSTECTOMY    . HERNIA REPAIR      Prior to Admission medications   Medication Sig Start Date End Date Taking? Authorizing Provider  albuterol (PROVENTIL HFA;VENTOLIN HFA) 108 (90 Base) MCG/ACT inhaler Inhale 1-2 puffs into the lungs every 6 (six) hours as needed for wheezing or shortness of breath. 06/10/16   Sam, Olivia Canter, PA-C  megestrol (MEGACE) 20 MG tablet Take 2 tablets (40 mg total) by mouth daily. 02/08/17   Leftwich-Kirby, Kathie Dike, CNM  megestrol (MEGACE) 20 MG tablet Take 2 tablets (40 mg total) by mouth 2 (two) times daily. 02/08/17   Leftwich-Kirby, Kathie Dike, CNM  meloxicam (MOBIC) 15 MG tablet Take 1 tablet (15 mg total) by mouth daily for 7 days. 03/13/18 03/20/18  Lannie Fields, PA-C  metroNIDAZOLE (FLAGYL) 500 MG tablet Take 1 tablet (500 mg total) by mouth 2 (two) times daily after a meal. 09/27/17   Lavonia Drafts, MD  naproxen (NAPROSYN) 500 MG tablet Take 1 tablet  (500 mg total) by mouth 2 (two) times daily with a meal. 01/13/18   Johnn Hai, PA-C  traMADol (ULTRAM) 50 MG tablet Take 1 tablet (50 mg total) by mouth every 6 (six) hours as needed. 02/23/18   Victorino Dike, FNP    Allergies Prednisone; Bactrim [sulfamethoxazole-trimethoprim]; Flagyl [metronidazole]; Morphine and related; Vicodin [hydrocodone-acetaminophen]; and Zofran [ondansetron hcl]  No family history on file.  Social History Social History   Tobacco Use  . Smoking status: Never Smoker  . Smokeless tobacco: Never Used  Substance Use Topics  . Alcohol use: No  . Drug use: No     Review of Systems  Constitutional: No fever/chills Eyes: No visual changes. No discharge ENT: No upper respiratory complaints. Cardiovascular: no chest pain. Respiratory: no cough. No SOB. Gastrointestinal: No abdominal pain.  No nausea, no vomiting.  No diarrhea.  No constipation. Genitourinary: Negative for dysuria. No hematuria Musculoskeletal: Patient has left shoulder pain.  Skin: Negative for rash, abrasions, lacerations, ecchymosis. Neurological: Negative for headaches, focal weakness or numbness.   ____________________________________________   PHYSICAL EXAM:  VITAL SIGNS: ED Triage Vitals  Enc Vitals Group     BP 03/13/18 1557 119/70     Pulse Rate 03/13/18 1557 88     Resp 03/13/18 1557 18     Temp 03/13/18 1557 98.6 F (37 C)     Temp Source 03/13/18 1557 Oral     SpO2 03/13/18 1557 97 %  Weight 03/13/18 1558 280 lb (127 kg)     Height 03/13/18 1558 5\' 8"  (1.727 m)     Head Circumference --      Peak Flow --      Pain Score 03/13/18 1558 10     Pain Loc --      Pain Edu? --      Excl. in [place]? --      Constitutional: Alert and oriented. Well appearing and in no acute distress. Eyes: Conjunctivae are normal. PERRL. EOMI. Head: Atraumatic. Cardiovascular: Normal rate, regular rhythm. Normal S1 and S2.  Good peripheral circulation. Respiratory: Normal  respiratory effort without tachypnea or retractions. Lungs CTAB. Good air entry to the bases with no decreased or absent breath sounds. Musculoskeletal: Full range of motion to all extremities.  Patient has no weakness elicited with left rotator cuff testing but does have pain.  Negative Yergason test.  Palpable radial pulse, left. Neurologic:  Normal speech and language. No gross focal neurologic deficits are appreciated.  Skin:  Skin is warm, dry and intact. No rash noted. Psychiatric: Mood and affect are normal. Speech and behavior are normal. Patient exhibits appropriate insight and judgement.   ____________________________________________   LABS (all labs ordered are listed, but only abnormal results are displayed)  Labs Reviewed - No data to display ____________________________________________  EKG   ____________________________________________  RADIOLOGY I personally viewed and evaluated these images as part of my medical decision making, as well as reviewing the written report by the radiologist.    Dg Shoulder Left  Result Date: 03/13/2018 CLINICAL DATA:  Injured left arm playing with his children 9 days ago. EXAM: LEFT SHOULDER - 2+ VIEW COMPARISON:  None. FINDINGS: No fracture or dislocation. Glenohumeral and acromioclavicular joint spaces appear preserved. No evidence of calcific tendinitis. Limited visualization of the adjacent thorax is normal. Regional soft tissues appear normal. IMPRESSION: No explanation for patient's left shoulder pain. Electronically Signed   By: Sandi Mariscal M.D.   On: 03/13/2018 17:53    ____________________________________________    PROCEDURES  Procedure(s) performed:    Procedures    Medications - No data to display   ____________________________________________   INITIAL IMPRESSION / ASSESSMENT AND PLAN / ED COURSE  Pertinent labs & imaging results that were available during my care of the patient were reviewed by me and  considered in my medical decision making (see chart for details).  Review of the [place] CSRS was performed in accordance of the [place] prior to dispensing any controlled drugs.      Assessment and Plan: Left shoulder pain:  Patient presents to the emergency department with left shoulder pain worsened with abduction and internal rotation at the left shoulder.  No weakness was elicited with left rotator cuff testing.  History and physical exam findings are consistent with rotator cuff tendinitis.  Patient was treated empirically with meloxicam and advised to follow-up with orthopedics.  All patient questions were answered. ____________________________________________  FINAL CLINICAL IMPRESSION(S) / ED DIAGNOSES  Final diagnoses:  Tendinitis of left rotator cuff      NEW MEDICATIONS STARTED DURING THIS VISIT:  ED Discharge Orders        Ordered    meloxicam (MOBIC) 15 MG tablet  Daily     03/13/18 1827          This chart was dictated using voice recognition software/Dragon. Despite best efforts to proofread, errors can occur which can change the meaning. Any change was purely unintentional.    Vallarie Mare  Curt Jews 03/13/18 1831    Nance Pear, MD 03/13/18 [phone]

## 2018-03-13 NOTE — ED Triage Notes (Signed)
States injured L arm pain since playing with kids 9 days ago and felt "pop"

## 2018-06-07 IMAGING — CR DG CHEST 2V
2 series · 2 of 2 positions shown · non-contrast
Comparison: PA and lateral chest 04/01/2016.

CLINICAL DATA: Intermittent chest pain today.

EXAM:
CHEST  2 VIEW

[chest pa]
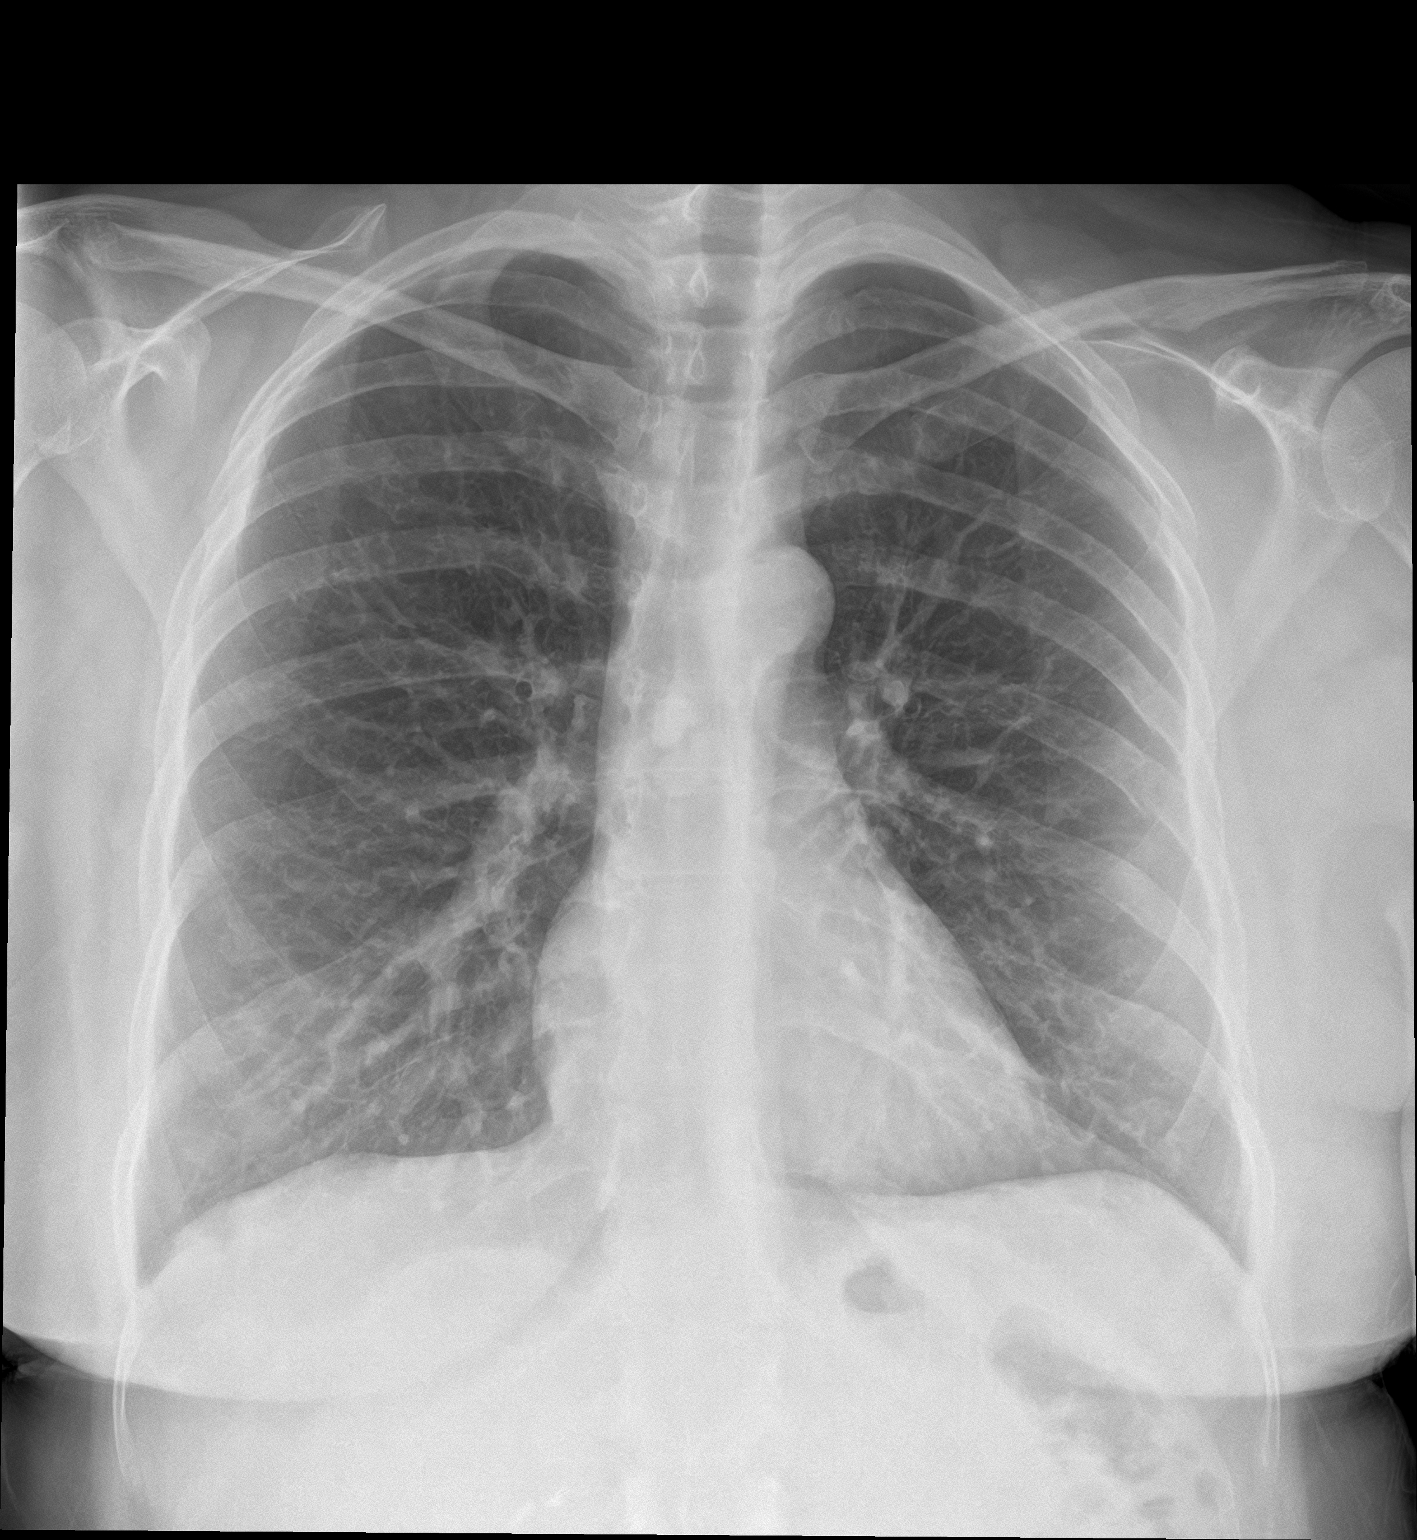

[chest lat]
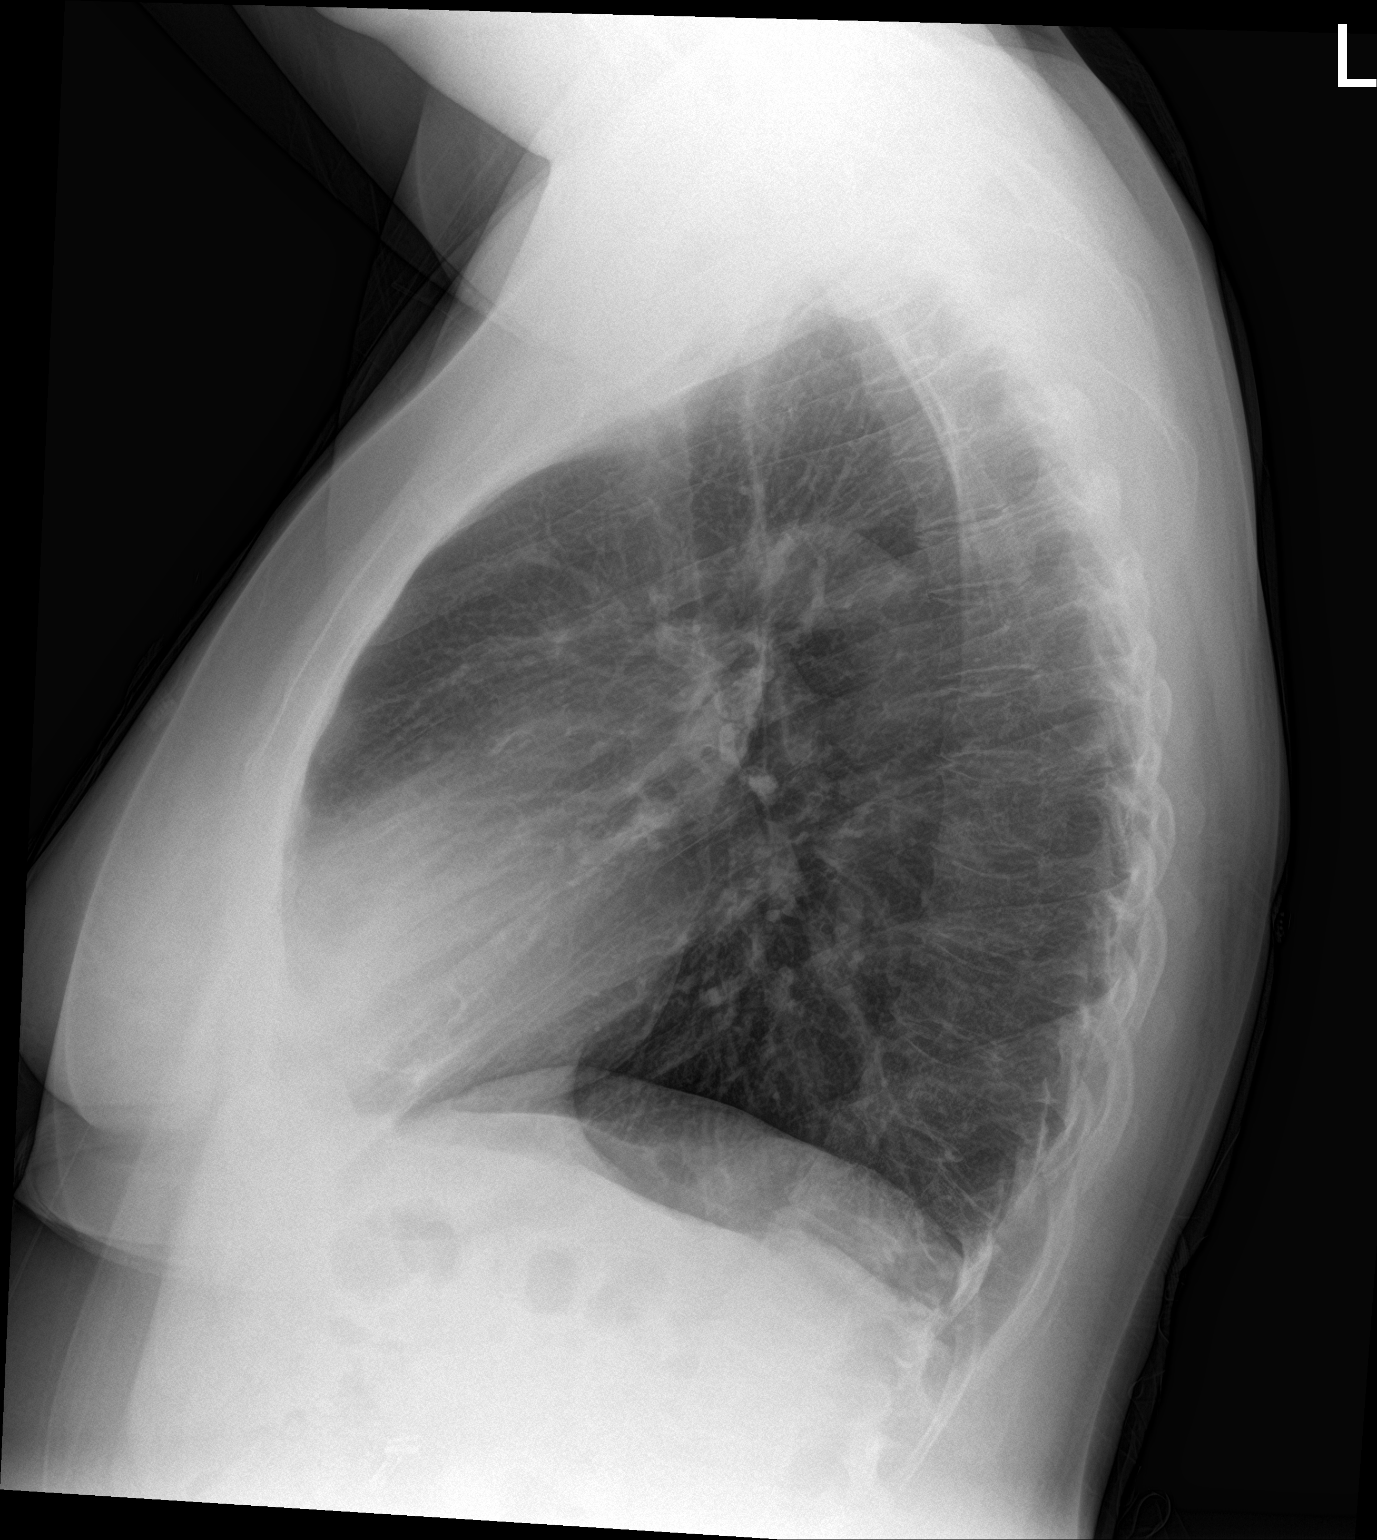

[2 of 2 positions shown; findings below may reference images not displayed]

FINDINGS: Lungs are clear. Heart size is normal. No pneumothorax or pleural
effusion. No acute bony abnormality. Remote left rib fractures are
unchanged.
IMPRESSION: No acute disease.

## 2018-06-30 ENCOUNTER — Other Ambulatory Visit: Payer: Self-pay

## 2018-06-30 ENCOUNTER — Emergency Department: Payer: Self-pay

## 2018-06-30 ENCOUNTER — Emergency Department
Admission: EM | Admit: 2018-06-30 | Discharge: 2018-06-30 | Disposition: A | Discharge status: Home / Self Care | Payer: Self-pay | Attending: Emergency Medicine | Admitting: Emergency Medicine

## 2018-06-30 DIAGNOSIS — J45909 Unspecified asthma, uncomplicated: Secondary | ICD-10-CM | POA: Insufficient documentation

## 2018-06-30 DIAGNOSIS — Z79899 Other long term (current) drug therapy: Secondary | ICD-10-CM | POA: Insufficient documentation

## 2018-06-30 DIAGNOSIS — R197 Diarrhea, unspecified: Secondary | ICD-10-CM | POA: Insufficient documentation

## 2018-06-30 DIAGNOSIS — B349 Viral infection, unspecified: Secondary | ICD-10-CM | POA: Insufficient documentation

## 2018-06-30 DIAGNOSIS — Z9049 Acquired absence of other specified parts of digestive tract: Secondary | ICD-10-CM | POA: Insufficient documentation

## 2018-06-30 LAB — GROUP A STREP BY PCR: Group A Strep by PCR: NOT DETECTED

## 2018-06-30 MED ORDER — LIDOCAINE VISCOUS HCL 2 % MT SOLN: 5.0000 mL | Freq: Four times a day (QID) | OROMUCOSAL | 0 refills | Status: DC | PRN

## 2018-06-30 MED ORDER — BENZONATATE 100 MG PO CAPS: 200.0000 mg | ORAL_CAPSULE | Freq: Three times a day (TID) | ORAL | 0 refills | Status: DC | PRN

## 2018-06-30 MED ORDER — DIPHENOXYLATE-ATROPINE 2.5-0.025 MG PO TABS: 1.0000 | ORAL_TABLET | Freq: Four times a day (QID) | ORAL | 1 refills | Status: DC | PRN

## 2018-06-30 NOTE — ED Notes (Signed)
See triage note  Presents with couple day hx of fever/chills and diarrhea  States temp yesterday was 101 at home but afebrile on arrival   Denies any vomiting but has had diarrhea  Last diarrhea stool was this am PTA

## 2018-06-30 NOTE — ED Triage Notes (Addendum)
Pt reports nonproductive cough since Sunday. Chills and diarrhea that began yesterday. Pt has small amounts of diarrhea when she coughs. Pt alert and oriented X4, active, cooperative, pt in NAD. RR even and unlabored, color WNL.  Friend with patient recently treated for bronchitis.

## 2018-06-30 NOTE — ED Provider Notes (Signed)
[facility] Emergency Department Provider Note   ____________________________________________   First MD Initiated Contact with Patient 06/30/18 [phone]     (approximate)  I have reviewed the triage vital signs and the nursing notes.   HISTORY  Chief Complaint Cough; Chills; and Diarrhea    HPI Kelsey Maynard is a 42 y.o. female patient complain of nonproductive cough and sore throat for 5 days.  Patient states developed chills and diarrhea yesterday.  Patient states she has small amount of loose stools whenever she coughed.  Patient denies nausea or vomiting.  Patient has been recently exposed to bronchitis.    Past Medical History:  Diagnosis Date  . Asthma   . Trichimoniasis     There are no active problems to display for this patient.   Past Surgical History:  Procedure Laterality Date  . CHOLECYSTECTOMY    . HERNIA REPAIR      Prior to Admission medications   Medication Sig Start Date End Date Taking? Authorizing Provider  albuterol (PROVENTIL HFA;VENTOLIN HFA) 108 (90 Base) MCG/ACT inhaler Inhale 1-2 puffs into the lungs every 6 (six) hours as needed for wheezing or shortness of breath. 06/10/16   Sam, Olivia Canter, PA-C  benzonatate (TESSALON PERLES) 100 MG capsule Take 2 capsules (200 mg total) by mouth 3 (three) times daily as needed. 06/30/18 06/30/19  Sable Feil, PA-C  diphenoxylate-atropine (LOMOTIL) 2.5-0.025 MG tablet Take 1 tablet by mouth 4 (four) times daily as needed for diarrhea or loose stools. 06/30/18 06/30/19  Sable Feil, PA-C  lidocaine (XYLOCAINE) 2 % solution Use as directed 5 mLs in the mouth or throat every 6 (six) hours as needed for mouth pain. 06/30/18   Sable Feil, PA-C  megestrol (MEGACE) 20 MG tablet Take 2 tablets (40 mg total) by mouth daily. 02/08/17   Leftwich-Kirby, Kathie Dike, CNM  megestrol (MEGACE) 20 MG tablet Take 2 tablets (40 mg total) by mouth 2 (two) times daily. 02/08/17   Leftwich-Kirby, Kathie Dike, CNM   metroNIDAZOLE (FLAGYL) 500 MG tablet Take 1 tablet (500 mg total) by mouth 2 (two) times daily after a meal. 09/27/17   Lavonia Drafts, MD  naproxen (NAPROSYN) 500 MG tablet Take 1 tablet (500 mg total) by mouth 2 (two) times daily with a meal. 01/13/18   Johnn Hai, PA-C  traMADol (ULTRAM) 50 MG tablet Take 1 tablet (50 mg total) by mouth every 6 (six) hours as needed. 02/23/18   Victorino Dike, FNP    Allergies Prednisone; Bactrim [sulfamethoxazole-trimethoprim]; Flagyl [metronidazole]; Morphine and related; Vicodin [hydrocodone-acetaminophen]; and Zofran [ondansetron hcl]  No family history on file.  Social History Social History   Tobacco Use  . Smoking status: Never Smoker  . Smokeless tobacco: Never Used  Substance Use Topics  . Alcohol use: No  . Drug use: No    Review of Systems  Constitutional: Chills.   Eyes: No visual changes. ENT: Sore throat. Cardiovascular: Denies chest pain. Respiratory: Denies shortness of breath.  Nonproductive cough. Gastrointestinal: No abdominal pain.  No nausea, no vomiting.  Diarrhea.  No constipation. Genitourinary: Negative for dysuria. Musculoskeletal: Negative for back pain. Skin: Negative for rash. Neurological: Negative for headaches, focal weakness or numbness. Allergic/Immunilogical: See medication list. ____________________________________________   PHYSICAL EXAM:  VITAL SIGNS: ED Triage Vitals  Enc Vitals Group     BP 06/30/18 0704 114/75     Pulse Rate 06/30/18 0704 85     Resp 06/30/18 0704 14  Temp 06/30/18 0704 98.5 F (36.9 C)     Temp Source 06/30/18 0704 Oral     SpO2 06/30/18 0704 97 %     Weight 06/30/18 0705 260 lb (117.9 kg)     Height 06/30/18 0705 5\' 8"  (1.727 m)     Head Circumference --      Peak Flow --      Pain Score 06/30/18 0705 9     Pain Loc --      Pain Edu? --      Excl. in [place]? --    Constitutional: Alert and oriented. Well appearing and in no acute distress. Eyes:  Conjunctivae are normal. PERRL. EOMI. Head: Atraumatic. Nose: No congestion/rhinnorhea. Mouth/Throat: Mucous membranes are moist.  Oropharynx erythematous. Neck: No stridor.  Hematological/Lymphatic/Immunilogical: No cervical lymphadenopathy. Cardiovascular: Normal rate, regular rhythm. Grossly normal heart sounds.  Good peripheral circulation. Respiratory: Normal respiratory effort.  No retractions. Lungs CTAB. Gastrointestinal: Soft and nontender. No distention.  Hyperactive bowel sounds.  No abdominal bruits. No CVA tenderness. Genitourinary: Deferred Neurologic:  Normal speech and language. No gross focal neurologic deficits are appreciated. No gait instability. Skin:  Skin is warm, dry and intact. No rash noted. Psychiatric: Mood and affect are normal. Speech and behavior are normal.  ____________________________________________   LABS (all labs ordered are listed, but only abnormal results are displayed)  Labs Reviewed  GROUP A STREP BY PCR   ____________________________________________  EKG   ____________________________________________  RADIOLOGY  ED MD interpretation:    Official radiology report(s): Dg Chest 2 View  Result Date: 06/30/2018 CLINICAL DATA:  Cough for 1 week EXAM: CHEST - 2 VIEW COMPARISON:  June 21, 2017 FINDINGS: Lungs are clear. Heart size and pulmonary vascularity are normal. No adenopathy. There is a calcified subcarinal lymph node, stable. There old healed rib fractures on the left. IMPRESSION: Evidence of prior granulomatous disease with calcified subcarinal lymph node. No edema or consolidation. No lymph node enlargement evident. Electronically Signed   By: Lowella Grip III M.D.   On: 06/30/2018 08:19    ____________________________________________   PROCEDURES  Procedure(s) performed: None  Procedures  Critical Care performed: No  ____________________________________________   INITIAL IMPRESSION / ASSESSMENT AND PLAN / ED  COURSE  As part of my medical decision making, I reviewed the following data within the [place]    Patient presents with fever/chills diarrhea.  Patient also has a sore throat.  Complaint consistent for viral illness.  Discussed negative chest x-ray and lab results with patient.  Patient given discharge care instruction work note.  Patient advised take medication as directed.  Patient advised follow-up      ____________________________________________   FINAL CLINICAL IMPRESSION(S) / ED DIAGNOSES  Final diagnoses:  Viral illness  Diarrhea, unspecified type     ED Discharge Orders         Ordered    lidocaine (XYLOCAINE) 2 % solution  Every 6 hours PRN     06/30/18 0845    benzonatate (TESSALON PERLES) 100 MG capsule  3 times daily PRN     06/30/18 0845    diphenoxylate-atropine (LOMOTIL) 2.5-0.025 MG tablet  4 times daily PRN     06/30/18 0845           Note:  This document was prepared using Dragon voice recognition software and may include unintentional dictation errors.    Sable Feil, PA-C 06/30/18 [place], Randall An, MD 06/30/18 [phone]

## 2018-08-07 ENCOUNTER — Emergency Department (HOSPITAL_COMMUNITY): Payer: No Typology Code available for payment source

## 2018-08-07 ENCOUNTER — Emergency Department (HOSPITAL_COMMUNITY)
Admission: EM | Admit: 2018-08-07 | Discharge: 2018-08-07 | Disposition: A | Discharge status: Home / Self Care | Payer: No Typology Code available for payment source | Attending: Emergency Medicine | Admitting: Emergency Medicine

## 2018-08-07 DIAGNOSIS — S9031XA Contusion of right foot, initial encounter: Secondary | ICD-10-CM | POA: Diagnosis not present

## 2018-08-07 DIAGNOSIS — Z79899 Other long term (current) drug therapy: Secondary | ICD-10-CM | POA: Insufficient documentation

## 2018-08-07 DIAGNOSIS — Y939 Activity, unspecified: Secondary | ICD-10-CM | POA: Diagnosis not present

## 2018-08-07 DIAGNOSIS — Y999 Unspecified external cause status: Secondary | ICD-10-CM | POA: Insufficient documentation

## 2018-08-07 DIAGNOSIS — M79671 Pain in right foot: Secondary | ICD-10-CM

## 2018-08-07 DIAGNOSIS — T07XXXA Unspecified multiple injuries, initial encounter: Secondary | ICD-10-CM | POA: Diagnosis present

## 2018-08-07 DIAGNOSIS — S46912A Strain of unspecified muscle, fascia and tendon at shoulder and upper arm level, left arm, initial encounter: Secondary | ICD-10-CM

## 2018-08-07 DIAGNOSIS — Y929 Unspecified place or not applicable: Secondary | ICD-10-CM | POA: Diagnosis not present

## 2018-08-07 MED ORDER — KETOROLAC TROMETHAMINE 15 MG/ML IJ SOLN
15.0000 mg | Freq: Once | INTRAMUSCULAR | Status: DC
  Filled 2018-08-07: qty 1

## 2018-08-07 MED ORDER — KETOROLAC TROMETHAMINE 30 MG/ML IJ SOLN
15.0000 mg | Freq: Once | INTRAMUSCULAR | Status: DC
Start: 2018-08-07 — End: 2018-08-07

## 2018-08-07 NOTE — Discharge Instructions (Signed)
As discussed, your evaluation today has been largely reassuring.  But, it is important that you monitor your condition carefully, and do not hesitate to return to the ED if you develop new, or concerning changes in your condition.  As ibuprofen, 400 mg, 3 times daily for pain control.  Otherwise, please follow-up with your physician for appropriate ongoing care.
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
## 2018-08-07 NOTE — ED Notes (Signed)
ED Provider at bedside. 

## 2018-11-15 ENCOUNTER — Telehealth: Payer: Self-pay | Admitting: *Deleted

## 2018-11-15 NOTE — Telephone Encounter (Signed)
Attempted to call pt.  Pt did not pick up and after several minutes of ringing the message, "we're sorry but your call cannot be completed as dialed."

## 2018-11-16 ENCOUNTER — Encounter: Payer: Self-pay | Admitting: Obstetrics & Gynecology

## 2019-04-13 ENCOUNTER — Other Ambulatory Visit: Payer: Self-pay

## 2019-04-13 ENCOUNTER — Encounter: Payer: Self-pay | Admitting: Emergency Medicine

## 2019-04-13 ENCOUNTER — Emergency Department
Admission: EM | Admit: 2019-04-13 | Discharge: 2019-04-13 | Disposition: A | Discharge status: Home / Self Care | Payer: Self-pay | Attending: Emergency Medicine | Admitting: Emergency Medicine

## 2019-04-13 DIAGNOSIS — B349 Viral infection, unspecified: Secondary | ICD-10-CM | POA: Insufficient documentation

## 2019-04-13 DIAGNOSIS — Z20828 Contact with and (suspected) exposure to other viral communicable diseases: Secondary | ICD-10-CM | POA: Insufficient documentation

## 2019-04-13 DIAGNOSIS — J45909 Unspecified asthma, uncomplicated: Secondary | ICD-10-CM | POA: Insufficient documentation

## 2019-04-13 LAB — SARS CORONAVIRUS 2 (TAT 6-24 HRS): SARS Coronavirus 2: NEGATIVE

## 2019-04-13 MED ORDER — PSEUDOEPH-BROMPHEN-DM 30-2-10 MG/5ML PO SYRP: 10.0000 mL | ORAL_SOLUTION | Freq: Four times a day (QID) | ORAL | 0 refills | Status: DC | PRN

## 2019-04-13 NOTE — ED Notes (Signed)
See triage note   States she developed chills and dry cough about 4-5 days ago  States fever last pm of 101 but afebrile on arrival   States she has gotten some relief with using inhaler

## 2019-04-13 NOTE — ED Provider Notes (Addendum)
[facility] Emergency Department Provider Note  ____________________________________________  Time seen: Approximately 9:14 AM  I have reviewed the triage vital signs and the nursing notes.   HISTORY  Chief Complaint Cough   HPI Kelsey Maynard is a 43 y.o. female who presents to the emergency department for treatment and evaluation of cough, chills, fever, and malaise.   Symptoms started approximately 4 5 days ago.  She states that she had a fever of 101 last night but took some Tylenol.   Past Medical History:  Diagnosis Date  . Asthma   . Trichimoniasis     There are no active problems to display for this patient.   Past Surgical History:  Procedure Laterality Date  . CHOLECYSTECTOMY    . HERNIA REPAIR      Prior to Admission medications   Medication Sig Start Date End Date Taking? Authorizing Provider  albuterol (PROVENTIL HFA;VENTOLIN HFA) 108 (90 Base) MCG/ACT inhaler Inhale 1-2 puffs into the lungs every 6 (six) hours as needed for wheezing or shortness of breath. 06/10/16   Sam, Olivia Canter, PA-C  brompheniramine-pseudoephedrine-DM 30-2-10 MG/5ML syrup Take 10 mLs by mouth 4 (four) times daily as needed. 04/13/19   Sherrie George B, FNP    Allergies Prednisone, Bactrim [sulfamethoxazole-trimethoprim], Flagyl [metronidazole], Morphine and related, Vicodin [hydrocodone-acetaminophen], and Zofran [ondansetron hcl]  No family history on file.  Social History Social History   Tobacco Use  . Smoking status: Never Smoker  . Smokeless tobacco: Never Used  Substance Use Topics  . Alcohol use: No  . Drug use: No    Review of Systems Constitutional: Positive for fever/chills.  Decreased appetite. ENT: Negative for sore throat. Cardiovascular: Denies chest pain. Respiratory: Negative for shortness of breath.  Positive for cough.  Negative for wheezing.  Gastrointestinal: Negative for nausea, no vomiting.  Negative for diarrhea.   Musculoskeletal: Positive for body aches Skin: Negative for rash. Neurological: Positive for headaches ____________________________________________   PHYSICAL EXAM:  VITAL SIGNS: ED Triage Vitals  Enc Vitals Group     BP 04/13/19 0855 109/74     Pulse Rate 04/13/19 0855 61     Resp 04/13/19 0854 16     Temp 04/13/19 0854 98.2 F (36.8 C)     Temp Source 04/13/19 0854 Oral     SpO2 04/13/19 0855 97 %     Weight 04/13/19 0854 270 lb (122.5 kg)     Height 04/13/19 0854 5\' 8"  (1.727 m)     Head Circumference --      Peak Flow --      Pain Score 04/13/19 0854 0     Pain Loc --      Pain Edu? --      Excl. in [address]? --     Constitutional: Alert and oriented.  Well appearing and in no acute distress. Eyes: Conjunctivae are normal. Ears: Bilateral TM normal. Nose: No sinus congestion noted;  no rhinnorhea. Mouth/Throat: Mucous membranes are moist.  Oropharynx normal. Tonsils flat. Uvula midline. Neck: No stridor.  Lymphatic: No cervical lymphadenopathy. Cardiovascular: Normal rate, regular rhythm. Good peripheral circulation. Respiratory: Respirations are even and unlabored.  No retractions. Breath sounds clear. Gastrointestinal: Soft and nontender.  Musculoskeletal: FROM x 4 extremities.  Neurologic:  Normal speech and language. Skin:  Skin is warm, dry and intact. No rash noted. Psychiatric: Mood and affect are normal. Speech and behavior are normal.  ____________________________________________   LABS (all labs ordered are listed, but only abnormal results are displayed)  Labs Reviewed  SARS CORONAVIRUS 2   ____________________________________________  EKG  Not indicated. ____________________________________________  RADIOLOGY  Not indicated. ____________________________________________   PROCEDURES  Procedure(s) performed: None  Critical Care performed: No ____________________________________________   INITIAL IMPRESSION / ASSESSMENT AND PLAN / ED  COURSE  43 y.o. female who presents to the emergency department for treatment and evaluation of symptoms as described in the HPI.  Patient will be tested for COVID-19.  She will be given a prescription for Bromfed to help with her cough.  She was encouraged to continue taking her Tylenol or ibuprofen for fever or body aches.  Quarantine instructions were discussed with her and then provided in writing.  She is to follow-up with her primary care provider for symptoms of concern or return to the emergency department if she is unable to schedule appointment.   Kelsey Maynard was evaluated in Emergency Department on 04/13/2019 for the symptoms described in the history of present illness. She was evaluated in the context of the global COVID-19 pandemic, which necessitated consideration that the patient might be at risk for infection with the SARS-CoV-2 virus that causes COVID-19. Institutional protocols and algorithms that pertain to the evaluation of patients at risk for COVID-19 are in a state of rapid change based on information released by regulatory bodies including the CDC and federal and state organizations. These policies and algorithms were followed during the patient's care in the ED.   Medications - No data to display  ED Discharge Orders         Ordered    brompheniramine-pseudoephedrine-DM 30-2-10 MG/5ML syrup  4 times daily PRN     04/13/19 0919           Pertinent labs & imaging results that were available during my care of the patient were reviewed by me and considered in my medical decision making (see chart for details).    If controlled substance prescribed during this visit, 12 month history viewed on the [place] prior to issuing an initial prescription for Schedule II or III opiod. ____________________________________________   FINAL CLINICAL IMPRESSION(S) / ED DIAGNOSES  Final diagnoses:  Viral illness    Note:  This document was prepared using Dragon voice  recognition software and may include unintentional dictation errors.    Victorino Dike, FNP 04/13/19 1521    Victorino Dike, FNP 04/13/19 1521    Delman Kitten, MD 04/13/19 [phone]

## 2019-04-13 NOTE — Discharge Instructions (Signed)
Please stay home until your COVID test is back. If it is negative, stay home until your symptoms have completely resolved. If it is positive, stay home for 2 weeks.

## 2019-04-13 NOTE — ED Triage Notes (Signed)
PT c/o cough xfew days. Denies any sob or fatigue. PT in NAD

## 2019-10-01 ENCOUNTER — Encounter: Payer: Self-pay | Admitting: Emergency Medicine

## 2019-10-01 ENCOUNTER — Emergency Department
Admission: EM | Admit: 2019-10-01 | Discharge: 2019-10-01 | Disposition: A | Discharge status: Home / Self Care | Payer: Self-pay | Attending: Emergency Medicine | Admitting: Emergency Medicine

## 2019-10-01 ENCOUNTER — Other Ambulatory Visit: Payer: Self-pay

## 2019-10-01 DIAGNOSIS — M722 Plantar fascial fibromatosis: Secondary | ICD-10-CM | POA: Insufficient documentation

## 2019-10-01 DIAGNOSIS — Z79899 Other long term (current) drug therapy: Secondary | ICD-10-CM | POA: Insufficient documentation

## 2019-10-01 DIAGNOSIS — J45909 Unspecified asthma, uncomplicated: Secondary | ICD-10-CM | POA: Insufficient documentation

## 2019-10-01 MED ORDER — MELOXICAM 15 MG PO TABS: 15.0000 mg | ORAL_TABLET | Freq: Every day | ORAL | 0 refills | Status: AC

## 2019-10-01 NOTE — ED Provider Notes (Signed)
[facility] Emergency Department Provider Note  ____________________________________________   First MD Initiated Contact with Patient 10/01/19 1359     (approximate)  I have reviewed the triage vital signs and the nursing notes.   HISTORY  Chief Complaint Foot Pain   HPI Kelsey Maynard is a 44 y.o. female presents to the ED with complaint of bilateral feet pain.  Patient states that it is worse with standing and she has been using a walker at home to get around.  Patient states that her job requires that she stand.  She has not been taking any over-the-counter medication.  She was also seen June 2020 for the same complaint.  At that time she was referred to a podiatrist which she did not follow-up on.  She states that the medication that was given to her at the time helped some.  No recent injury to either foot.  She rates her pain as a 10/10.      Past Medical History:  Diagnosis Date  . Asthma   . Trichimoniasis     There are no problems to display for this patient.   Past Surgical History:  Procedure Laterality Date  . CHOLECYSTECTOMY    . HERNIA REPAIR      Prior to Admission medications   Medication Sig Start Date End Date Taking? Authorizing Provider  albuterol (PROVENTIL HFA;VENTOLIN HFA) 108 (90 Base) MCG/ACT inhaler Inhale 1-2 puffs into the lungs every 6 (six) hours as needed for wheezing or shortness of breath. 06/10/16   Sam, Olivia Canter, PA-C  meloxicam (MOBIC) 15 MG tablet Take 1 tablet (15 mg total) by mouth daily. 10/01/19 10/31/19  Johnn Hai, PA-C    Allergies Prednisone, Bactrim [sulfamethoxazole-trimethoprim], Flagyl [metronidazole], Morphine and related, Vicodin [hydrocodone-acetaminophen], and Zofran [ondansetron hcl]  No family history on file.  Social History Social History   Tobacco Use  . Smoking status: Never Smoker  . Smokeless tobacco: Never Used  Substance Use Topics  . Alcohol use: No  . Drug use: No     Review of Systems Constitutional: No fever/chills Cardiovascular: Denies chest pain. Respiratory: Denies shortness of breath. Gastrointestinal: No abdominal pain.  No nausea, no vomiting.   Musculoskeletal: Bilateral foot pain. Skin: Negative for rash. Neurological: Negative for headaches, focal weakness or numbness. ____________________________________________   PHYSICAL EXAM:  VITAL SIGNS: ED Triage Vitals  Enc Vitals Group     BP 10/01/19 1329 133/79     Pulse Rate 10/01/19 1329 77     Resp 10/01/19 1329 16     Temp 10/01/19 1329 98.9 F (37.2 C)     Temp Source 10/01/19 1329 Oral     SpO2 10/01/19 1329 100 %     Weight 10/01/19 1330 280 lb (127 kg)     Height 10/01/19 1330 5\' 8"  (1.727 m)     Head Circumference --      Peak Flow --      Pain Score 10/01/19 1330 10     Pain Loc --      Pain Edu? --      Excl. in [place]? --     Constitutional: Alert and oriented. Well appearing and in no acute distress. Eyes: Conjunctivae are normal.  Head: Atraumatic. Neck: No stridor.   Cardiovascular: Normal rate, regular rhythm. Grossly normal heart sounds.  Good peripheral circulation. Respiratory: Normal respiratory effort.  No retractions. Lungs CTAB. Musculoskeletal: Bilateral feet show no signs of an acute injury.  Skin is intact and no  discoloration noted.  Pulses present bilaterally.  Motor sensory function intact.  Patient is able move digits distally without any difficulty.  There is moderate tenderness on palpation of the medial aspect and the arch and also and the plantar aspect of the calcaneus. Neurologic:  Normal speech and language. No gross focal neurologic deficits are appreciated.  Skin:  Skin is warm, dry and intact. No rash noted. Psychiatric: Mood and affect are normal. Speech and behavior are normal.  ____________________________________________   LABS (all labs ordered are listed, but only abnormal results are displayed)  Labs Reviewed - No data to  display  RADIOLOGY  Official radiology report(s): No results found.  X-ray of the right foot was reviewed from 01/2019. ____________________________________________   PROCEDURES  Procedure(s) performed (including Critical Care):  Procedures ____________________________________________   INITIAL IMPRESSION / ASSESSMENT AND PLAN / ED COURSE  As part of my medical decision making, I reviewed the following data within the electronic MEDICAL RECORD NUMBER Notes from prior ED visits and [place] Controlled Substance Database  44 year old female presents to the ED with complaint of bilateral foot pain which is chronic but states that it is worsened in the last week.  She states that she has been using a walker at home to get about.  There has been no recent injury.  Patient was told last year that she had osteoarthritis of her feet and was given a prescription for meloxicam which she states helped.  She did not follow-up with the podiatrist.  Physical exam today is consistent with plantar fasciitis.  Patient was instructed to get shoes that are supportive or get inserts to place inside her shoes.  She is also given a prescription for meloxicam to continue daily and to make an appointment with Dr. Vickki Muff who is the podiatrist on call today.  ____________________________________________   FINAL CLINICAL IMPRESSION(S) / ED DIAGNOSES  Final diagnoses:  Plantar fasciitis, bilateral     ED Discharge Orders         Ordered    meloxicam (MOBIC) 15 MG tablet  Daily     10/01/19 1447           Note:  This document was prepared using Dragon voice recognition software and may include unintentional dictation errors.    Johnn Hai, PA-C 10/01/19 1527    Vanessa King and Queen, MD 10/02/19 1054

## 2019-10-01 NOTE — ED Notes (Signed)
See triage note  Presents with bilateral feet pain    States pain is worse with standing and flexion  States pain started about 1 week ago

## 2019-10-01 NOTE — ED Triage Notes (Signed)
Patient reports pain in bilateral feet x1 week. Reports she has been having to use a walker at home to get around. Report pain with flexion of feet, even without weightbearing. Patient states she has been unable to work a full day due to the pain. Denies any known injury.

## 2019-10-01 NOTE — Discharge Instructions (Addendum)
Call make an appoint with Dr. Vickki Muff who is the podiatrist on call.  Also wear shoes that have good arch support.  If you do not have a pair of shoes with arch support you may also buy inserts to put inside your shoes now to help with support.  Also began taking the medication sent to your pharmacy.  At night you may apply ice to your feet or freeze a bottle of water and then roll the bottle of frozen water under your foot back and forth several times each night.

## 2020-03-15 ENCOUNTER — Emergency Department
Admission: EM | Admit: 2020-03-15 | Discharge: 2020-03-15 | Disposition: A | Discharge status: Home / Self Care | Payer: Self-pay | Attending: Emergency Medicine | Admitting: Emergency Medicine

## 2020-03-15 ENCOUNTER — Other Ambulatory Visit: Payer: Self-pay

## 2020-03-15 DIAGNOSIS — J020 Streptococcal pharyngitis: Secondary | ICD-10-CM | POA: Insufficient documentation

## 2020-03-15 DIAGNOSIS — R0981 Nasal congestion: Secondary | ICD-10-CM | POA: Insufficient documentation

## 2020-03-15 DIAGNOSIS — R509 Fever, unspecified: Secondary | ICD-10-CM | POA: Insufficient documentation

## 2020-03-15 DIAGNOSIS — R05 Cough: Secondary | ICD-10-CM | POA: Insufficient documentation

## 2020-03-15 DIAGNOSIS — J45909 Unspecified asthma, uncomplicated: Secondary | ICD-10-CM | POA: Insufficient documentation

## 2020-03-15 LAB — GROUP A STREP BY PCR: Group A Strep by PCR: NOT DETECTED

## 2020-03-15 MED ORDER — AMOXICILLIN 500 MG PO CAPS: 500.0000 mg | ORAL_CAPSULE | Freq: Three times a day (TID) | ORAL | 0 refills | Status: DC

## 2020-03-15 NOTE — ED Provider Notes (Signed)
[facility] Emergency Department Provider Note  ____________________________________________   First MD Initiated Contact with Patient 03/15/20 1716     (approximate)  I have reviewed the triage vital signs and the nursing notes.   HISTORY  Chief Complaint Sore Throat    HPI Kelsey Maynard is a 44 y.o. female presents emergency department complaining of sore throat with white patches and fever last night.  Some cough and congestion.  No chest pain or shortness of breath.  Retain sense of taste and smell.  Had family in from [place] but states this feels like strep and not Covid.    Past Medical History:  Diagnosis Date  . Asthma   . Trichimoniasis     There are no problems to display for this patient.   Past Surgical History:  Procedure Laterality Date  . CHOLECYSTECTOMY    . HERNIA REPAIR      Prior to Admission medications   Medication Sig Start Date End Date Taking? Authorizing Provider  albuterol (PROVENTIL HFA;VENTOLIN HFA) 108 (90 Base) MCG/ACT inhaler Inhale 1-2 puffs into the lungs every 6 (six) hours as needed for wheezing or shortness of breath. 06/10/16   Sam, Olivia Canter, PA-C  amoxicillin (AMOXIL) 500 MG capsule Take 1 capsule (500 mg total) by mouth 3 (three) times daily. 03/15/20   Versie Starks, PA-C    Allergies Prednisone, Bactrim [sulfamethoxazole-trimethoprim], Flagyl [metronidazole], Morphine and related, Vicodin [hydrocodone-acetaminophen], and Zofran [ondansetron hcl]  History reviewed. No pertinent family history.  Social History Social History   Tobacco Use  . Smoking status: Never Smoker  . Smokeless tobacco: Never Used  Substance Use Topics  . Alcohol use: No  . Drug use: No    Review of Systems  Constitutional: Positive fever/chills Eyes: No visual changes. ENT: Positive sore throat. Respiratory: Denies cough Cardiovascular: Denies chest pain Gastrointestinal: Denies abdominal pain Genitourinary:  Negative for dysuria. Musculoskeletal: Negative for back pain. Skin: Negative for rash. Psychiatric: no mood changes,     ____________________________________________   PHYSICAL EXAM:  VITAL SIGNS: ED Triage Vitals  Enc Vitals Group     BP 03/15/20 1628 104/80     Pulse Rate 03/15/20 1626 85     Resp 03/15/20 1626 16     Temp 03/15/20 1626 98.2 F (36.8 C)     Temp Source 03/15/20 1626 Oral     SpO2 03/15/20 1626 100 %     Weight 03/15/20 1628 (!) 305 lb (138.3 kg)     Height 03/15/20 1628 5\' 8"  (1.727 m)     Head Circumference --      Peak Flow --      Pain Score 03/15/20 1628 8     Pain Loc --      Pain Edu? --      Excl. in [place]? --     Constitutional: Alert and oriented. Well appearing and in no acute distress. Eyes: Conjunctivae are normal.  Head: Atraumatic. Nose: No congestion/rhinnorhea. Mouth/Throat: Mucous membranes are moist.  Throat is red and swollen with a large amount of white exudate noted posteriorly Neck:  supple no lymphadenopathy noted Cardiovascular: Normal rate, regular rhythm. Heart sounds are normal Respiratory: Normal respiratory effort.  No retractions, lungs c t a  GU: deferred Musculoskeletal: FROM all extremities, warm and well perfused Neurologic:  Normal speech and language.  Skin:  Skin is warm, dry and intact. No rash noted. Psychiatric: Mood and affect are normal. Speech and behavior are normal.  ____________________________________________  LABS (all labs ordered are listed, but only abnormal results are displayed)  Labs Reviewed  GROUP A STREP BY PCR   ____________________________________________   ____________________________________________  RADIOLOGY    ____________________________________________   PROCEDURES  Procedure(s) performed: No  Procedures    ____________________________________________   INITIAL IMPRESSION / ASSESSMENT AND PLAN / ED COURSE  Pertinent labs & imaging results that were  available during my care of the patient were reviewed by me and considered in my medical decision making (see chart for details).   Patient is a 44 year old female presents emergency department sore throat with concerns of strep throat.  Physical exam is consistent with strep throat or mononucleosis.  I do feel this is more strep related as she does not have a lot of swelling along with tonsils  Strep test ordered   Patient will be discharged with amoxicillin.  She wants to leave and not wait much longer.  Do feel that she is stable and most likely has strep throat.  Explained her I will call her tomorrow with her strep results.   As part of my medical decision making, I reviewed the following data within the [place] notes reviewed and incorporated, Labs reviewed , Old chart reviewed, Notes from prior ED visits and [place] Controlled Substance Database  ____________________________________________   FINAL CLINICAL IMPRESSION(S) / ED DIAGNOSES  Final diagnoses:  Strep throat      NEW MEDICATIONS STARTED DURING THIS VISIT:  New Prescriptions   AMOXICILLIN (AMOXIL) 500 MG CAPSULE    Take 1 capsule (500 mg total) by mouth 3 (three) times daily.     Note:  This document was prepared using Dragon voice recognition software and may include unintentional dictation errors.    Versie Starks, PA-C 03/15/20 1851    Duffy Bruce, MD 03/16/20 1517

## 2020-03-15 NOTE — ED Triage Notes (Signed)
Pt comes in POV c/o sore throat, white patches, and fever at night.

## 2020-03-15 NOTE — ED Notes (Signed)
Pt states sore throat x 3 days

## 2020-03-15 NOTE — Discharge Instructions (Addendum)
Follow-up with your regular doctor if not improving to 3 days.  Return emergency department worsening.  Use amoxicillin as prescribed.  Gargle with warm salt water.

## 2020-03-15 NOTE — ED Notes (Signed)
Patient declined discharge vital signs. 

## 2020-07-29 ENCOUNTER — Encounter: Payer: Self-pay | Admitting: Emergency Medicine

## 2020-07-29 ENCOUNTER — Other Ambulatory Visit: Payer: Self-pay

## 2020-07-29 ENCOUNTER — Emergency Department
Admission: EM | Admit: 2020-07-29 | Discharge: 2020-07-29 | Disposition: A | Discharge status: Home / Self Care | Payer: Self-pay | Attending: Emergency Medicine | Admitting: Emergency Medicine

## 2020-07-29 DIAGNOSIS — J45909 Unspecified asthma, uncomplicated: Secondary | ICD-10-CM | POA: Insufficient documentation

## 2020-07-29 DIAGNOSIS — J0100 Acute maxillary sinusitis, unspecified: Secondary | ICD-10-CM | POA: Insufficient documentation

## 2020-07-29 DIAGNOSIS — Z20822 Contact with and (suspected) exposure to covid-19: Secondary | ICD-10-CM | POA: Insufficient documentation

## 2020-07-29 LAB — RESP PANEL BY RT-PCR (FLU A&B, COVID) ARPGX2
Influenza A by PCR: NEGATIVE
Influenza B by PCR: NEGATIVE
SARS Coronavirus 2 by RT PCR: NEGATIVE

## 2020-07-29 MED ORDER — AMOXICILLIN-POT CLAVULANATE 875-125 MG PO TABS
1.0000 | ORAL_TABLET | Freq: Once | ORAL | Status: AC
  Administered 2020-07-29: 1 via ORAL
  Filled 2020-07-29: qty 1

## 2020-07-29 MED ORDER — AMOXICILLIN-POT CLAVULANATE 875-125 MG PO TABS: 1.0000 | ORAL_TABLET | Freq: Two times a day (BID) | ORAL | 0 refills | Status: DC

## 2020-07-29 MED ORDER — OXYMETAZOLINE HCL 0.05 % NA SOLN: 2.0000 | Freq: Two times a day (BID) | NASAL | 0 refills | Status: DC

## 2020-07-29 NOTE — ED Provider Notes (Signed)
[facility] Emergency Department Provider Note  ____________________________________________  Time seen: Approximately 9:33 PM  I have reviewed the triage vital signs and the nursing notes.   HISTORY  Chief Complaint Nasal Congestion and Chills    HPI Kelsey Maynard is a 44 y.o. female who presents the emergency department complaining of nasal congestion, postnasal drip.  Patient states that she has had sinus pressure, nasal congestion for couple of days.  She believes she has a sinus infection.  Patient states that she may have had a subjective low-grade fever she felt warm today but did not take her temperature.  She states that she does take her temperature at work and has never had a fever.  No sore throat, no cough, no shortness of breath.  No recent sick contacts.  Patient has had sinus infections in the past with similar symptoms.         Past Medical History:  Diagnosis Date  . Asthma   . Trichimoniasis     There are no problems to display for this patient.   Past Surgical History:  Procedure Laterality Date  . CHOLECYSTECTOMY    . HERNIA REPAIR      Prior to Admission medications   Medication Sig Start Date End Date Taking? Authorizing Provider  albuterol (PROVENTIL HFA;VENTOLIN HFA) 108 (90 Base) MCG/ACT inhaler Inhale 1-2 puffs into the lungs every 6 (six) hours as needed for wheezing or shortness of breath. 06/10/16   Sam, Olivia Canter, PA-C  amoxicillin (AMOXIL) 500 MG capsule Take 1 capsule (500 mg total) by mouth 3 (three) times daily. 03/15/20   Fisher, Linden Dolin, PA-C  amoxicillin-clavulanate (AUGMENTIN) 875-125 MG tablet Take 1 tablet by mouth 2 (two) times daily. 07/29/20   Docia Klar, Charline Bills, PA-C  oxymetazoline (AFRIN) 0.05 % nasal spray Place 2 sprays into both nostrils 2 (two) times daily. Use for no more than 3 consecutive days. 07/29/20 07/29/21  Erasmo Vertz, Charline Bills, PA-C    Allergies Metronidazole, Prednisone,  Sulfamethoxazole-trimethoprim, Morphine and related, Vicodin [hydrocodone-acetaminophen], Hydrocodone-acetaminophen, Morphine, and Ondansetron hcl  History reviewed. No pertinent family history.  Social History Social History   Tobacco Use  . Smoking status: Never Smoker  . Smokeless tobacco: Never Used  Substance Use Topics  . Alcohol use: No  . Drug use: No     Review of Systems  Constitutional: No fever/chills Eyes: No visual changes. No discharge ENT: Nasal congestion and sinus pressure Cardiovascular: no chest pain. Respiratory: no cough. No SOB. Gastrointestinal: No abdominal pain.  No nausea, no vomiting.  No diarrhea.  No constipation. Musculoskeletal: Negative for musculoskeletal pain. Skin: Negative for rash, abrasions, lacerations, ecchymosis. Neurological: Negative for headaches, focal weakness or numbness.  10 System ROS otherwise negative.  ____________________________________________   PHYSICAL EXAM:  VITAL SIGNS: ED Triage Vitals  Enc Vitals Group     BP 07/29/20 2040 129/73     Pulse Rate 07/29/20 2040 85     Resp 07/29/20 2040 20     Temp 07/29/20 2040 99.1 F (37.3 C)     Temp Source 07/29/20 2040 Oral     SpO2 07/29/20 2040 99 %     Weight 07/29/20 2041 295 lb (133.8 kg)     Height 07/29/20 2041 5\' 8"  (1.727 m)     Head Circumference --      Peak Flow --      Pain Score 07/29/20 2041 0     Pain Loc --      Pain Edu? --  Excl. in [place]? --      Constitutional: Alert and oriented. Well appearing and in no acute distress. Eyes: Conjunctivae are normal. PERRL. EOMI. Head: Atraumatic. ENT:      Ears: EACs and TMs unremarkable bilaterally.      Nose: Moderate clear congestion/rhinnorhea.  No significant tenderness to percussion.      Mouth/Throat: Mucous membranes are moist.  Neck: No stridor.  Neck is supple full range of motion Hematological/Lymphatic/Immunilogical: No cervical lymphadenopathy Cardiovascular: Normal rate, regular  rhythm. Normal S1 and S2.  Good peripheral circulation. Respiratory: Normal respiratory effort without tachypnea or retractions. Lungs CTAB. Good air entry to the bases with no decreased or absent breath sounds. Musculoskeletal: Full range of motion to all extremities. No gross deformities appreciated. Neurologic:  Normal speech and language. No gross focal neurologic deficits are appreciated.  Skin:  Skin is warm, dry and intact. No rash noted. Psychiatric: Mood and affect are normal. Speech and behavior are normal. Patient exhibits appropriate insight and judgement.   ____________________________________________   LABS (all labs ordered are listed, but only abnormal results are displayed)  Labs Reviewed  RESP PANEL BY RT-PCR (FLU A&B, COVID) ARPGX2   ____________________________________________  EKG   ____________________________________________  RADIOLOGY   No results found.  ____________________________________________    PROCEDURES  Procedure(s) performed:    Procedures    Medications  amoxicillin-clavulanate (AUGMENTIN) 875-125 MG per tablet 1 tablet (1 tablet Oral Given 07/29/20 2205)     ____________________________________________   INITIAL IMPRESSION / ASSESSMENT AND PLAN / ED COURSE  Pertinent labs & imaging results that were available during my care of the patient were reviewed by me and considered in my medical decision making (see chart for details).  Review of the [place] CSRS was performed in accordance of the [place] prior to dispensing any controlled drugs.           Patient's diagnosis is consistent with sinusitis.  Patient presented to the emergency department sinus pressure, congestion.  No recent sick contacts.  Low suspicion at this time for Covid but I will swab for the patient's job.  Patient be treated with antibiotics, Flonase for symptom relief..  Follow-up primary care as needed.  Patient is given ED precautions to return to the ED for  any worsening or new symptoms.     ____________________________________________  FINAL CLINICAL IMPRESSION(S) / ED DIAGNOSES  Final diagnoses:  Acute maxillary sinusitis, recurrence not specified      NEW MEDICATIONS STARTED DURING THIS VISIT:  ED Discharge Orders         Ordered    amoxicillin-clavulanate (AUGMENTIN) 875-125 MG tablet  2 times daily        07/29/20 2220    oxymetazoline (AFRIN) 0.05 % nasal spray  2 times daily        07/29/20 2220              This chart was dictated using voice recognition software/Dragon. Despite best efforts to proofread, errors can occur which can change the meaning. Any change was purely unintentional.    Darletta Moll, PA-C 07/29/20 2220    Nance Pear, MD 07/29/20 [phone]

## 2020-07-29 NOTE — ED Triage Notes (Signed)
Pt arrived via POV with c/o runny nose and post nasal drip, chills and 100 temp at home. Pt states sxs began on Fri. Denies covid contacts.

## 2020-08-31 ENCOUNTER — Encounter (HOSPITAL_COMMUNITY): Payer: Self-pay | Admitting: Emergency Medicine

## 2020-08-31 ENCOUNTER — Emergency Department (HOSPITAL_COMMUNITY)
Admission: EM | Admit: 2020-08-31 | Discharge: 2020-08-31 | Disposition: A | Discharge status: Home / Self Care | Payer: BLUE CROSS/BLUE SHIELD | Attending: Emergency Medicine | Admitting: Emergency Medicine

## 2020-08-31 ENCOUNTER — Emergency Department (HOSPITAL_COMMUNITY)
Admission: AD | Admit: 2020-08-31 | Discharge: 2020-08-31 | Disposition: A | Discharge status: Home / Self Care | Payer: BLUE CROSS/BLUE SHIELD | Attending: Obstetrics and Gynecology | Admitting: Obstetrics and Gynecology

## 2020-08-31 ENCOUNTER — Other Ambulatory Visit: Payer: Self-pay

## 2020-08-31 DIAGNOSIS — Z5321 Procedure and treatment not carried out due to patient leaving prior to being seen by health care provider: Secondary | ICD-10-CM | POA: Diagnosis not present

## 2020-08-31 DIAGNOSIS — R519 Headache, unspecified: Secondary | ICD-10-CM | POA: Diagnosis not present

## 2020-08-31 DIAGNOSIS — Z9851 Tubal ligation status: Secondary | ICD-10-CM

## 2020-08-31 DIAGNOSIS — R109 Unspecified abdominal pain: Secondary | ICD-10-CM | POA: Diagnosis not present

## 2020-08-31 DIAGNOSIS — Z3202 Encounter for pregnancy test, result negative: Secondary | ICD-10-CM

## 2020-08-31 DIAGNOSIS — R509 Fever, unspecified: Secondary | ICD-10-CM | POA: Insufficient documentation

## 2020-08-31 LAB — CBC
HCT: 41 % (ref 36.0–46.0)
Hemoglobin: 13.6 g/dL (ref 12.0–15.0)
MCH: 34.1 pg — ABNORMAL HIGH (ref 26.0–34.0)
MCHC: 33.2 g/dL (ref 30.0–36.0)
MCV: 102.8 fL — ABNORMAL HIGH (ref 80.0–100.0)
Platelets: 257 10*3/uL (ref 150–400)
RBC: 3.99 MIL/uL (ref 3.87–5.11)
RDW: 12.7 % (ref 11.5–15.5)
WBC: 6.6 10*3/uL (ref 4.0–10.5)
nRBC: 0 % (ref 0.0–0.2)

## 2020-08-31 LAB — COMPREHENSIVE METABOLIC PANEL
ALT: 15 U/L (ref 0–44)
AST: 20 U/L (ref 15–41)
Albumin: 4.5 g/dL (ref 3.5–5.0)
Alkaline Phosphatase: 67 U/L (ref 38–126)
Anion gap: 10 (ref 5–15)
BUN: 11 mg/dL (ref 6–20)
CO2: 21 mmol/L — ABNORMAL LOW (ref 22–32)
Calcium: 9.4 mg/dL (ref 8.9–10.3)
Chloride: 106 mmol/L (ref 98–111)
Creatinine, Ser: 0.86 mg/dL (ref 0.44–1.00)
GFR, Estimated: >60 mL/min (ref >60)
Glucose, Bld: 88 mg/dL (ref 70–99)
Potassium: 4.1 mmol/L (ref 3.5–5.1)
Sodium: 137 mmol/L (ref 135–145)
Total Bilirubin: 0.6 mg/dL (ref 0.3–1.2)
Total Protein: 8 g/dL (ref 6.5–8.1)

## 2020-08-31 LAB — I-STAT BETA HCG BLOOD, ED (MC, WL, AP ONLY): I-stat hCG, quantitative: <5 m[IU]/mL (ref <5)

## 2020-08-31 LAB — POCT PREGNANCY, URINE: Preg Test, Ur: NEGATIVE

## 2020-08-31 LAB — LIPASE, BLOOD: Lipase: 24 U/L (ref 11–51)

## 2020-08-31 MED ORDER — ACETAMINOPHEN 325 MG PO TABS
650.0000 mg | ORAL_TABLET | Freq: Once | ORAL | Status: AC | PRN
  Administered 2020-08-31: 650 mg via ORAL
  Filled 2020-08-31: qty 2

## 2020-08-31 NOTE — MAU Note (Signed)
Kelsey Maynard is a 45 y.o. here in MAU reporting: chills and abdominal pain that started today. Also states she had a fever of 102. States she is not pregnant, states she has had a tubal.  LMP: 08/20/20  Onset of complaint: today  Pain score: 10/10  Vitals:   08/31/20 1323  BP: 111/66  Pulse: (!) 108  Resp: 18  Temp: (!) 101.4 F (38.6 C)  SpO2: 99%    Lab orders placed from triage: UPT

## 2020-08-31 NOTE — MAU Provider Note (Signed)
Event Date/Time  First Provider Initiated Contact with Patient 08/31/20 1330     S Ms. Kelsey Maynard is a 45 y.o. [id number] patient who presents to MAU today with complaint of right mid-abdominal pain, new onset three days ago. She also endorses fever and chills, new onset this morning. She denies aggravating or alleviating factors. She has not taken medication or tried other treatments for this complaint. She denies RLQ pain, dysuria, SOB, weakness, syncope.  OB/GYN history significant for tubal ligation. Patient does not believe she is pregnant.  O BP 111/66 (BP Location: Left Arm)   Pulse (!) 108   Temp (!) 101.4 F (38.6 C) (Oral)   Resp 18   Ht 5\' 8"  (1.727 m)   Wt 97.1 kg   LMP 08/20/2020   SpO2 99% Comment: room air  BMI 32.55 kg/m    Physical Exam Vitals and nursing note reviewed. Exam conducted with a chaperone present.  Constitutional:      Appearance: She is well-developed. She is ill-appearing.  Cardiovascular:     Rate and Rhythm: Tachycardia present.  Pulmonary:     Effort: Pulmonary effort is normal.  Skin:    Capillary Refill: Capillary refill takes less than 2 seconds.  Neurological:     Mental Status: She is alert and oriented to person, place, and time.  Psychiatric:        Mood and Affect: Mood normal.        Behavior: Behavior normal.    A Medical screening exam complete Negative urine pregnancy test S/p tubal ligation  P Discharge from MAU in stable condition Transfer to [facility] per pt request Report called, Dr. 08/22/2020 accepting MD  Madilyn Hook, CNM 08/31/2020 1:49 PM

## 2020-08-31 NOTE — MAU Note (Signed)
Report given to [facility]. Transport notified

## 2020-08-31 NOTE — ED Notes (Signed)
Patient reports allergy to Vicodin but reports she is able to take tylenol without difficulty. Will administer PO tylenol for fever per triage order set.

## 2020-08-31 NOTE — ED Notes (Signed)
Pt came from MAU to ED and is unable to be located for triage.  Called pt x 3.

## 2020-08-31 NOTE — ED Triage Notes (Signed)
Patient c/o right flank pain with fever and headache x2 days.

## 2020-09-11 ENCOUNTER — Ambulatory Visit: Payer: BLUE CROSS/BLUE SHIELD | Admitting: Internal Medicine

## 2020-12-25 ENCOUNTER — Encounter: Payer: Self-pay | Admitting: Emergency Medicine

## 2020-12-25 ENCOUNTER — Emergency Department
Admission: EM | Admit: 2020-12-25 | Discharge: 2020-12-26 | Disposition: A | Discharge status: Home / Self Care | Payer: BLUE CROSS/BLUE SHIELD | Attending: Emergency Medicine | Admitting: Emergency Medicine

## 2020-12-25 ENCOUNTER — Other Ambulatory Visit: Payer: Self-pay

## 2020-12-25 DIAGNOSIS — E860 Dehydration: Secondary | ICD-10-CM | POA: Diagnosis not present

## 2020-12-25 DIAGNOSIS — K529 Noninfective gastroenteritis and colitis, unspecified: Secondary | ICD-10-CM | POA: Diagnosis not present

## 2020-12-25 DIAGNOSIS — J45909 Unspecified asthma, uncomplicated: Secondary | ICD-10-CM | POA: Diagnosis not present

## 2020-12-25 DIAGNOSIS — R109 Unspecified abdominal pain: Secondary | ICD-10-CM | POA: Diagnosis present

## 2020-12-25 LAB — COMPREHENSIVE METABOLIC PANEL
ALT: 12 U/L (ref 0–44)
AST: 21 U/L (ref 15–41)
Albumin: 4.3 g/dL (ref 3.5–5.0)
Alkaline Phosphatase: 56 U/L (ref 38–126)
Anion gap: 9 (ref 5–15)
BUN: 21 mg/dL — ABNORMAL HIGH (ref 6–20)
CO2: 22 mmol/L (ref 22–32)
Calcium: 9.2 mg/dL (ref 8.9–10.3)
Chloride: 107 mmol/L (ref 98–111)
Creatinine, Ser: 0.91 mg/dL (ref 0.44–1.00)
GFR, Estimated: >60 mL/min (ref >60)
Glucose, Bld: 83 mg/dL (ref 70–99)
Potassium: 3.9 mmol/L (ref 3.5–5.1)
Sodium: 138 mmol/L (ref 135–145)
Total Bilirubin: 0.9 mg/dL (ref 0.3–1.2)
Total Protein: 7.7 g/dL (ref 6.5–8.1)

## 2020-12-25 LAB — CBC
HCT: 41.9 % (ref 36.0–46.0)
Hemoglobin: 14 g/dL (ref 12.0–15.0)
MCH: 33.7 pg (ref 26.0–34.0)
MCHC: 33.4 g/dL (ref 30.0–36.0)
MCV: 100.7 fL — ABNORMAL HIGH (ref 80.0–100.0)
Platelets: 233 10*3/uL (ref 150–400)
RBC: 4.16 MIL/uL (ref 3.87–5.11)
RDW: 12.2 % (ref 11.5–15.5)
WBC: 10 10*3/uL (ref 4.0–10.5)
nRBC: 0 % (ref 0.0–0.2)

## 2020-12-25 LAB — LIPASE, BLOOD: Lipase: 39 U/L (ref 11–51)

## 2020-12-25 MED ORDER — LACTATED RINGERS IV BOLUS
1000.0000 mL | Freq: Once | INTRAVENOUS | Status: AC
  Administered 2020-12-25: 1000 mL via INTRAVENOUS

## 2020-12-25 MED ORDER — METOCLOPRAMIDE HCL 10 MG PO TABS: 10.0000 mg | ORAL_TABLET | Freq: Three times a day (TID) | ORAL | 0 refills | Status: DC | PRN

## 2020-12-25 MED ORDER — METOCLOPRAMIDE HCL 5 MG/ML IJ SOLN
10.0000 mg | Freq: Once | INTRAMUSCULAR | Status: AC
  Administered 2020-12-25: 10 mg via INTRAVENOUS
  Filled 2020-12-25: qty 2

## 2020-12-25 MED ORDER — DICYCLOMINE HCL 10 MG PO CAPS
10.0000 mg | ORAL_CAPSULE | ORAL | Status: DC
  Filled 2020-12-25: qty 1

## 2020-12-25 NOTE — ED Provider Notes (Signed)
[facility] Emergency Department Provider Note  ____________________________________________   Event Date/Time   First MD Initiated Contact with Patient 12/25/20 2227     (approximate)  I have reviewed the triage vital signs and the nursing notes.   HISTORY  Chief Complaint Abdominal Pain   HPI Kelsey Maynard is a 45 y.o. female with a past medical history of asthma who presents accompanied by her husband for assessment of acute onset of nonbloody nonbilious vomiting diarrhea and crampy abdominal pain earlier today.  No earache, sore throat, chest pain, cough, fevers, back pain, urinary symptoms, blood in emesis or urine, rash or extremity pain.  No recent falls or injuries.  No recent EtOH use.  No recent illicit drug use.  No clear alleviating aggravating factors.  No other acute concerns at this time.         Past Medical History:  Diagnosis Date  . Asthma   . Trichimoniasis     There are no problems to display for this patient.   Past Surgical History:  Procedure Laterality Date  . CHOLECYSTECTOMY    . HERNIA REPAIR      Prior to Admission medications   Medication Sig Start Date End Date Taking? Authorizing Provider  metoCLOPramide (REGLAN) 10 MG tablet Take 1 tablet (10 mg total) by mouth every 8 (eight) hours as needed for up to 3 days for nausea or vomiting. 12/25/20 12/28/20 Yes Lucrezia Starch, MD  albuterol (PROVENTIL HFA;VENTOLIN HFA) 108 (90 Base) MCG/ACT inhaler Inhale 1-2 puffs into the lungs every 6 (six) hours as needed for wheezing or shortness of breath. 06/10/16   Sam, Olivia Canter, PA-C    Allergies Metronidazole, Prednisone, Sulfamethoxazole-trimethoprim, Vicodin [hydrocodone-acetaminophen], Hydrocodone-acetaminophen, Morphine, and Ondansetron hcl  No family history on file.  Social History Social History   Tobacco Use  . Smoking status: Never Smoker  . Smokeless tobacco: Never Used  Vaping Use  . Vaping Use: Never  used  Substance Use Topics  . Alcohol use: No  . Drug use: No    Review of Systems  Review of Systems  Constitutional: Negative for chills and fever.  HENT: Negative for sore throat.   Eyes: Negative for pain.  Respiratory: Negative for cough and stridor.   Cardiovascular: Negative for chest pain.  Gastrointestinal: Positive for abdominal pain, diarrhea, nausea and vomiting.  Genitourinary: Negative for dysuria.  Musculoskeletal: Negative for myalgias.  Skin: Negative for rash.  Neurological: Negative for seizures, loss of consciousness and headaches.  Psychiatric/Behavioral: Negative for suicidal ideas.  All other systems reviewed and are negative.     ____________________________________________   PHYSICAL EXAM:  VITAL SIGNS: ED Triage Vitals  Enc Vitals Group     BP 12/25/20 2030 123/79     Pulse Rate 12/25/20 2030 93     Resp 12/25/20 2030 17     Temp 12/25/20 2030 98.7 F (37.1 C)     Temp Source 12/25/20 2030 Oral     SpO2 12/25/20 2025 98 %     Weight 12/25/20 2029 275 lb (124.7 kg)     Height 12/25/20 2029 5\' 8"  (1.727 m)     Head Circumference --      Peak Flow --      Pain Score 12/25/20 2039 10     Pain Loc --      Pain Edu? --      Excl. in [place]? --    Vitals:   12/25/20 2025 12/25/20 2030  BP:  123/79  Pulse:  93  Resp:  17  Temp:  98.7 F (37.1 C)  SpO2: 98% 99%   Physical Exam Vitals and nursing note reviewed.  Constitutional:      General: She is not in acute distress.    Appearance: She is well-developed.  HENT:     Head: Normocephalic and atraumatic.     Right Ear: External ear normal.     Left Ear: External ear normal.     Nose: Nose normal.     Mouth/Throat:     Mouth: Mucous membranes are dry.  Eyes:     Conjunctiva/sclera: Conjunctivae normal.  Cardiovascular:     Rate and Rhythm: Normal rate and regular rhythm.     Heart sounds: No murmur heard.   Pulmonary:     Effort: Pulmonary effort is normal. No respiratory  distress.     Breath sounds: Normal breath sounds.  Abdominal:     Palpations: Abdomen is soft.     Tenderness: There is no abdominal tenderness. There is no right CVA tenderness or left CVA tenderness.  Musculoskeletal:     Cervical back: Neck supple.  Skin:    General: Skin is warm and dry.     Capillary Refill: Capillary refill takes 2 to 3 seconds.  Neurological:     Mental Status: She is alert and oriented to person, place, and time.  Psychiatric:        Mood and Affect: Mood normal.      ____________________________________________   LABS (all labs ordered are listed, but only abnormal results are displayed)  Labs Reviewed  COMPREHENSIVE METABOLIC PANEL - Abnormal; Notable for the following components:      Result Value   BUN 21 (*)    All other components within normal limits  CBC - Abnormal; Notable for the following components:   MCV 100.7 (*)    All other components within normal limits  LIPASE, BLOOD  URINALYSIS, COMPLETE (UACMP) WITH MICROSCOPIC  POC URINE PREG, ED   ____________________________________________  EKG  ____________________________________________  RADIOLOGY  ED MD interpretation:    Official radiology report(s): No results found.  ____________________________________________   PROCEDURES  Procedure(s) performed (including Critical Care):  Procedures   ____________________________________________   INITIAL IMPRESSION / ASSESSMENT AND PLAN / ED COURSE      Patient presents with above-stated history exam for assessment of acute onset of nonbloody nonbilious vomiting and diarrhea with some crampy abdominal pain.  On arrival she is afebrile and hemodynamically stable.  She does appear little dry on exam and endorses some crampy abdominal pain but has no significant tenderness on deep palpation and is not peritonitis or guarding.  No CVA tenderness.  Suspect likely acute infectious gastroenteritis.  CBC shows no leukocytosis or  acute anemia.  Given absence of fever or tenderness in the right lower quadrant or right upper quadrant and low suspicion for acute cholecystitis or appendicitis.  Given acute onset of GI symptoms without any focal pelvic pain and patient denying any urinary symptoms or abdominal discharge suspicion for PID torsion.  Lipase of 39 not consistent with acute pancreatitis.  Additional differential includes possible cystitis although no CVA tenderness or fever or leukocytosis to suggest pyelonephritis at this time.  Initial plan is IV fluids Reglan and Bentyl.    Care patient signed over to oncoming fire at approximately 2300.  Plan is to follow-up patient after she has received antiemetics and fluids and if able to tolerate p.o. likely discharge home with outpatient follow-up.  ____________________________________________   FINAL CLINICAL IMPRESSION(S) / ED DIAGNOSES  Final diagnoses:  Gastroenteritis  Dehydration    Medications  metoCLOPramide (REGLAN) injection 10 mg (has no administration in time range)  lactated ringers bolus 1,000 mL (has no administration in time range)  dicyclomine (BENTYL) capsule 10 mg (has no administration in time range)     ED Discharge Orders         Ordered    metoCLOPramide (REGLAN) 10 MG tablet  Every 8 hours PRN        12/25/20 2346           Note:  This document was prepared using Dragon voice recognition software and may include unintentional dictation errors.   Lucrezia Starch, MD 12/25/20 [phone]

## 2020-12-25 NOTE — ED Triage Notes (Signed)
Pt in via [facility] from work, reports sudden onset N/V/D w/ abdominal pain.  Dry heaving in triage.  Vitals WDL.

## 2020-12-25 NOTE — ED Triage Notes (Signed)
EMS brings pt in from work for c/o rt lower abd pain accomp by N/V, sudden onset

## 2020-12-26 LAB — URINALYSIS, COMPLETE (UACMP) WITH MICROSCOPIC
Bacteria, UA: NONE SEEN
Bilirubin Urine: NEGATIVE
Glucose, UA: NEGATIVE mg/dL
Ketones, ur: 80 mg/dL — AB
Leukocytes,Ua: NEGATIVE
Nitrite: NEGATIVE
Protein, ur: NEGATIVE mg/dL
Specific Gravity, Urine: 1.028 (ref 1.005–1.030)
pH: 7 (ref 5.0–8.0)

## 2020-12-26 NOTE — ED Provider Notes (Addendum)
Accepted care of this patient from Dr. Hulan Saas at 11 PM.  Patient presented with abdominal cramping, vomiting and diarrhea.  He requested that I reassess patient after IV medication.  Patient received IV fluids and Reglan.  Feels markedly improved.  Tolerating p.o. labs with no significant abnormalities other than mild ketonuria.  Will discharge home on Reglan Dr. Thompson Caul plan.  Abdomen is benign on exam.  Recommended close follow-up with PCP and discussed my standard return precautions.   Alfred Levins, [place], MD 12/26/20 [place], [place], MD 12/26/20 [phone]

## 2021-07-08 ENCOUNTER — Other Ambulatory Visit: Payer: Self-pay

## 2021-07-08 ENCOUNTER — Emergency Department (HOSPITAL_COMMUNITY)
Admission: EM | Admit: 2021-07-08 | Discharge: 2021-07-08 | Disposition: A | Discharge status: Home / Self Care | Payer: BLUE CROSS/BLUE SHIELD | Attending: Emergency Medicine | Admitting: Emergency Medicine

## 2021-07-08 DIAGNOSIS — J45909 Unspecified asthma, uncomplicated: Secondary | ICD-10-CM | POA: Insufficient documentation

## 2021-07-08 DIAGNOSIS — J069 Acute upper respiratory infection, unspecified: Secondary | ICD-10-CM | POA: Diagnosis not present

## 2021-07-08 DIAGNOSIS — U071 COVID-19: Secondary | ICD-10-CM | POA: Insufficient documentation

## 2021-07-08 DIAGNOSIS — R059 Cough, unspecified: Secondary | ICD-10-CM | POA: Diagnosis present

## 2021-07-08 LAB — RESP PANEL BY RT-PCR (FLU A&B, COVID) ARPGX2
Influenza A by PCR: NEGATIVE
Influenza B by PCR: NEGATIVE
SARS Coronavirus 2 by RT PCR: POSITIVE — AB

## 2021-07-08 MED ORDER — ALBUTEROL SULFATE HFA 108 (90 BASE) MCG/ACT IN AERS: 1.0000 | INHALATION_SPRAY | Freq: Four times a day (QID) | RESPIRATORY_TRACT | 6 refills | Status: AC | PRN

## 2021-07-08 MED ORDER — ALBUTEROL SULFATE HFA 108 (90 BASE) MCG/ACT IN AERS
2.0000 | INHALATION_SPRAY | Freq: Once | RESPIRATORY_TRACT | Status: AC
  Administered 2021-07-08: 2 via RESPIRATORY_TRACT
  Filled 2021-07-08: qty 6.7

## 2021-07-08 NOTE — Discharge Instructions (Signed)
Follow-up in your MyChart account for your COVID/flu test results.  If these test results are negative, you may return to work.  If your tests are positive, discussed with your employer to return to work per workplace protocol.

## 2021-07-08 NOTE — ED Triage Notes (Signed)
Cough and fever a few days, hx of asthma

## 2021-07-08 NOTE — ED Provider Notes (Signed)
[facility] EMERGENCY DEPARTMENT Provider Note   CSN: [id number] Arrival date & time: 07/08/21  2992     History No chief complaint on file.   Kelsey Maynard is a 45 y.o. female.  45 year old female presents with complaint of cough and congestion.  Patient states her symptoms started over the weekend, she managed at home with OTC cough medications and her symptoms have largely improved however she continues to have a cough and is out of her inhaler.  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  No other complaints or concerns today.      Past Medical History:  Diagnosis Date   Asthma    Trichimoniasis     There are no problems to display for this patient.   Past Surgical History:  Procedure Laterality Date   CHOLECYSTECTOMY     HERNIA REPAIR       OB History     Gravida  5   Para  5   Term  5   Preterm      AB      Living  5      SAB      IAB      Ectopic      Multiple      Live Births  5           No family history on file.  Social History   Tobacco Use   Smoking status: Never   Smokeless tobacco: Never  Vaping Use   Vaping Use: Never used  Substance Use Topics   Alcohol use: No   Drug use: No    Home Medications Prior to Admission medications   Medication Sig Start Date End Date Taking? Authorizing Provider  albuterol (VENTOLIN HFA) 108 (90 Base) MCG/ACT inhaler Inhale 1-2 puffs into the lungs every 6 (six) hours as needed for wheezing or shortness of breath. 07/08/21  Yes Tacy Learn, PA-C  metoCLOPramide (REGLAN) 10 MG tablet Take 1 tablet (10 mg total) by mouth every 8 (eight) hours as needed for up to 3 days for nausea or vomiting. 12/25/20 12/28/20  Lucrezia Starch, MD    Allergies    Metronidazole, Prednisone, Sulfamethoxazole-trimethoprim, Vicodin [hydrocodone-acetaminophen], Hydrocodone-acetaminophen, Morphine, and Ondansetron  hcl  Review of Systems   Review of Systems  Constitutional:  Negative for chills and fever.  HENT:  Positive for congestion. Negative for sore throat.   Respiratory:  Positive for cough and wheezing.   Gastrointestinal:  Negative for nausea and vomiting.  Musculoskeletal:  Negative for arthralgias and myalgias.  Skin:  Negative for rash and wound.  Allergic/Immunologic: Negative for immunocompromised state.  Neurological:  Negative for headaches.  Hematological:  Negative for adenopathy.  Psychiatric/Behavioral:  Negative for confusion.   All other systems reviewed and are negative.  Physical Exam Updated Vital Signs BP 110/84 (BP Location: Right Arm)   Pulse 84   Temp 98.5 F (36.9 C) (Oral)   Resp 18   Ht 5\' 8"  (1.727 m)   Wt 124 kg   SpO2 99%   BMI 41.57 kg/m   Physical Exam Vitals and nursing note reviewed.  Constitutional:      General: She is not in acute distress.    Appearance: She is well-developed. She is not diaphoretic.  HENT:     Head: Normocephalic and atraumatic.     Right Ear: Tympanic membrane and ear canal normal.  Left Ear: Tympanic membrane and ear canal normal.     Nose: Nose normal.     Mouth/Throat:     Mouth: Mucous membranes are moist.     Pharynx: No oropharyngeal exudate or posterior oropharyngeal erythema.  Eyes:     Conjunctiva/sclera: Conjunctivae normal.  Cardiovascular:     Rate and Rhythm: Normal rate and regular rhythm.     Pulses: Normal pulses.     Heart sounds: Normal heart sounds.  Pulmonary:     Effort: Pulmonary effort is normal.     Breath sounds: Normal breath sounds.  Musculoskeletal:     Cervical back: Neck supple.     Right lower leg: No edema.     Left lower leg: No edema.  Lymphadenopathy:     Cervical: No cervical adenopathy.  Skin:    General: Skin is warm and dry.     Findings: No erythema or rash.  Neurological:     Mental Status: She is alert and oriented to person, place, and time.  Psychiatric:         Behavior: Behavior normal.    ED Results / Procedures / Treatments   Labs (all labs ordered are listed, but only abnormal results are displayed) Labs Reviewed  RESP PANEL BY RT-PCR (FLU A&B, COVID) ARPGX2    EKG None  Radiology No results found.  Procedures Procedures   Medications Ordered in ED Medications  albuterol (VENTOLIN HFA) 108 (90 Base) MCG/ACT inhaler 2 puff (2 puffs Inhalation Given 07/08/21 0724)    ED Course  I have reviewed the triage vital signs and the nursing notes.  Pertinent labs & imaging results that were available during my care of the patient were reviewed by me and considered in my medical decision making (see chart for details).  Clinical Course as of 07/08/21 0823  Wed Jul 09, 6715  270 45 year old female with complaint of URI symptoms onset over the weekend.  Symptoms have largely improved however continues to cough, history of asthma, on inhaler.  COVID/flu swab obtained, advised to follow-up in her MyChart account for these results and return to work accordingly.  Given inhaler.  Recommend recheck with PCP as needed. [LM]  [id number] Exam overall unremarkable, lungs clear to auscultation.  Has occasional dry cough in the room.  Vitals reviewed and reassuring including room air sat 99%, afebrile. [LM]    Clinical Course User Index [LM] Roque Lias   MDM Rules/Calculators/A&P                           Final Clinical Impression(s) / ED Diagnoses Final diagnoses:  Viral URI with cough    Rx / DC Orders ED Discharge Orders          Ordered    albuterol (VENTOLIN HFA) 108 (90 Base) MCG/ACT inhaler  Every 6 hours PRN        07/08/21 0732             Tacy Learn, PA-C 07/08/21 0017    Davonna Belling, MD 07/08/21 1920

## 2021-08-06 ENCOUNTER — Encounter (HOSPITAL_COMMUNITY): Payer: Self-pay

## 2021-08-06 ENCOUNTER — Other Ambulatory Visit: Payer: Self-pay

## 2021-08-06 ENCOUNTER — Ambulatory Visit (HOSPITAL_COMMUNITY)
Admission: EM | Admit: 2021-08-06 | Discharge: 2021-08-06 | Disposition: A | Discharge status: Home / Self Care | Payer: BLUE CROSS/BLUE SHIELD | Attending: Emergency Medicine | Admitting: Emergency Medicine

## 2021-08-06 DIAGNOSIS — L02412 Cutaneous abscess of left axilla: Secondary | ICD-10-CM

## 2021-08-06 MED ORDER — DOXYCYCLINE HYCLATE 100 MG PO CAPS: 100.0000 mg | ORAL_CAPSULE | Freq: Two times a day (BID) | ORAL | 0 refills | Status: DC

## 2021-08-06 NOTE — ED Triage Notes (Signed)
Pt presents with c/o  2 abscesses on her L side.   Pt states the last few days her arm has been hurting and states she feels she might have an infection.

## 2021-08-06 NOTE — ED Provider Notes (Signed)
[facility]    CSN: [id number] Arrival date & time: 08/06/21  1635      History   Chief Complaint Chief Complaint  Patient presents with   Abscess    HPI Kelsey Maynard is a 45 y.o. female.  Patient reports shaving her left axilla and subsequently developing an abscess there.  She has been treating it with hot showers and antibiotic ointment.  2 days ago it opened up and drained pus.  Since then she has developed a second abscess in her left axilla.  She has been using the same treatment for this second abscess.  She is worried about the opened abscess and wonders if it needs to be closed.  No history of abscesses.   Abscess Associated symptoms: no fever    Past Medical History:  Diagnosis Date   Asthma    Trichimoniasis     There are no problems to display for this patient.   Past Surgical History:  Procedure Laterality Date   CHOLECYSTECTOMY     HERNIA REPAIR      OB History     Gravida  5   Para  5   Term  5   Preterm      AB      Living  5      SAB      IAB      Ectopic      Multiple      Live Births  5            Home Medications    Prior to Admission medications   Medication Sig Start Date End Date Taking? Authorizing Provider  doxycycline (VIBRAMYCIN) 100 MG capsule Take 1 capsule (100 mg total) by mouth 2 (two) times daily. 08/06/21  Yes Carvel Getting, NP  albuterol (VENTOLIN HFA) 108 (90 Base) MCG/ACT inhaler Inhale 1-2 puffs into the lungs every 6 (six) hours as needed for wheezing or shortness of breath. 07/08/21   Tacy Learn, PA-C  metoCLOPramide (REGLAN) 10 MG tablet Take 1 tablet (10 mg total) by mouth every 8 (eight) hours as needed for up to 3 days for nausea or vomiting. 12/25/20 12/28/20  Lucrezia Starch, MD    Family History History reviewed. No pertinent family history.  Social History Social History   Tobacco Use   Smoking status: Never   Smokeless tobacco: Never  Vaping Use   Vaping Use: Never  used  Substance Use Topics   Alcohol use: No   Drug use: No     Allergies   Metronidazole, Prednisone, Sulfamethoxazole-trimethoprim, Vicodin [hydrocodone-acetaminophen], Hydrocodone-acetaminophen, Morphine, and Ondansetron hcl   Review of Systems Review of Systems  Constitutional:  Negative for chills and fever.  Skin:        Abscesses left axilla.    Physical Exam Triage Vital Signs ED Triage Vitals  Enc Vitals Group     BP 08/06/21 1740 117/74     Pulse Rate 08/06/21 1740 66     Resp 08/06/21 1740 19     Temp 08/06/21 1740 98.2 F (36.8 C)     Temp Source 08/06/21 1740 Oral     SpO2 08/06/21 1740 98 %     Weight --      Height --      Head Circumference --      Peak Flow --      Pain Score 08/06/21 1738 6     Pain Loc --      Pain  Edu? --      Excl. in [place]? --    No data found.  Updated Vital Signs BP 117/74 (BP Location: Right Arm)   Pulse 66   Temp 98.2 F (36.8 C) (Oral)   Resp 19   LMP 07/31/2021 (Exact Date)   SpO2 98%   Visual Acuity Right Eye Distance:   Left Eye Distance:   Bilateral Distance:    Right Eye Near:   Left Eye Near:    Bilateral Near:     Physical Exam Constitutional:      General: She is in acute distress.     Appearance: Normal appearance. She is not ill-appearing.  Pulmonary:     Effort: Pulmonary effort is normal.  Chest:    Neurological:     Mental Status: She is alert.     UC Treatments / Results  Labs (all labs ordered are listed, but only abnormal results are displayed) Labs Reviewed - No data to display  EKG   Radiology No results found.  Procedures Procedures (including critical care time)  Medications Ordered in UC Medications - No data to display  Initial Impression / Assessment and Plan / UC Course  I have reviewed the triage vital signs and the nursing notes.  Pertinent labs & imaging results that were available during my care of the patient were reviewed by me and considered in my medical  decision making (see chart for details).    Discussed frequent warm compresses with clean washcloths each time. Rx doxycycline. Given reasons for returning.   Final Clinical Impressions(s) / UC Diagnoses   Final diagnoses:  Abscess of left axilla   Discharge Instructions   None    ED Prescriptions     Medication Sig Dispense Auth. Provider   doxycycline (VIBRAMYCIN) 100 MG capsule Take 1 capsule (100 mg total) by mouth 2 (two) times daily. 20 capsule Carvel Getting, NP      PDMP not reviewed this encounter.   Carvel Getting, NP 08/06/21 [phone]

## 2021-08-18 ENCOUNTER — Encounter: Payer: Self-pay | Admitting: Emergency Medicine

## 2021-08-18 ENCOUNTER — Other Ambulatory Visit: Payer: Self-pay

## 2021-08-18 ENCOUNTER — Emergency Department: Payer: BLUE CROSS/BLUE SHIELD

## 2021-08-18 ENCOUNTER — Emergency Department
Admission: EM | Admit: 2021-08-18 | Discharge: 2021-08-18 | Disposition: A | Discharge status: Home / Self Care | Payer: BLUE CROSS/BLUE SHIELD | Attending: Emergency Medicine | Admitting: Emergency Medicine

## 2021-08-18 DIAGNOSIS — K429 Umbilical hernia without obstruction or gangrene: Secondary | ICD-10-CM | POA: Insufficient documentation

## 2021-08-18 DIAGNOSIS — J45909 Unspecified asthma, uncomplicated: Secondary | ICD-10-CM | POA: Diagnosis not present

## 2021-08-18 DIAGNOSIS — R1011 Right upper quadrant pain: Secondary | ICD-10-CM

## 2021-08-18 LAB — COMPREHENSIVE METABOLIC PANEL
ALT: 12 U/L (ref 0–44)
AST: 18 U/L (ref 15–41)
Albumin: 4.2 g/dL (ref 3.5–5.0)
Alkaline Phosphatase: 61 U/L (ref 38–126)
Anion gap: 6 (ref 5–15)
BUN: 16 mg/dL (ref 6–20)
CO2: 25 mmol/L (ref 22–32)
Calcium: 9.3 mg/dL (ref 8.9–10.3)
Chloride: 108 mmol/L (ref 98–111)
Creatinine, Ser: 0.77 mg/dL (ref 0.44–1.00)
GFR, Estimated: >60 mL/min (ref >60)
Glucose, Bld: 99 mg/dL (ref 70–99)
Potassium: 4.3 mmol/L (ref 3.5–5.1)
Sodium: 139 mmol/L (ref 135–145)
Total Bilirubin: 0.6 mg/dL (ref 0.3–1.2)
Total Protein: 7.5 g/dL (ref 6.5–8.1)

## 2021-08-18 LAB — URINALYSIS, ROUTINE W REFLEX MICROSCOPIC
Bacteria, UA: NONE SEEN
Bilirubin Urine: NEGATIVE
Glucose, UA: NEGATIVE mg/dL
Hgb urine dipstick: NEGATIVE
Ketones, ur: 20 mg/dL — AB
Leukocytes,Ua: NEGATIVE
Nitrite: NEGATIVE
Protein, ur: 30 mg/dL — AB
Specific Gravity, Urine: 1.031 — ABNORMAL HIGH (ref 1.005–1.030)
WBC, UA: NONE SEEN WBC/hpf (ref 0–5)
pH: 6 (ref 5.0–8.0)

## 2021-08-18 LAB — CBC WITH DIFFERENTIAL/PLATELET
Abs Immature Granulocytes: 0.04 10*3/uL (ref 0.00–0.07)
Basophils Absolute: 0.1 10*3/uL (ref 0.0–0.1)
Basophils Relative: 0 %
Eosinophils Absolute: 0 10*3/uL (ref 0.0–0.5)
Eosinophils Relative: 0 %
HCT: 38.7 % (ref 36.0–46.0)
Hemoglobin: 13.4 g/dL (ref 12.0–15.0)
Immature Granulocytes: 0 %
Lymphocytes Relative: 8 %
Lymphs Abs: 0.9 10*3/uL (ref 0.7–4.0)
MCH: 34.9 pg — ABNORMAL HIGH (ref 26.0–34.0)
MCHC: 34.6 g/dL (ref 30.0–36.0)
MCV: 100.8 fL — ABNORMAL HIGH (ref 80.0–100.0)
Monocytes Absolute: 0.5 10*3/uL (ref 0.1–1.0)
Monocytes Relative: 4 %
Neutro Abs: 10.6 10*3/uL — ABNORMAL HIGH (ref 1.7–7.7)
Neutrophils Relative %: 88 %
Platelets: 255 10*3/uL (ref 150–400)
RBC: 3.84 MIL/uL — ABNORMAL LOW (ref 3.87–5.11)
RDW: 12.5 % (ref 11.5–15.5)
WBC: 12.2 10*3/uL — ABNORMAL HIGH (ref 4.0–10.5)
nRBC: 0 % (ref 0.0–0.2)

## 2021-08-18 LAB — LIPASE, BLOOD: Lipase: 28 U/L (ref 11–51)

## 2021-08-18 LAB — HCG, QUANTITATIVE, PREGNANCY: hCG, Beta Chain, Quant, S: <1 m[IU]/mL (ref <5)

## 2021-08-18 LAB — POC URINE PREG, ED: Preg Test, Ur: NEGATIVE

## 2021-08-18 MED ORDER — OXYCODONE-ACETAMINOPHEN 5-325 MG PO TABS: 1.0000 | ORAL_TABLET | Freq: Three times a day (TID) | ORAL | 0 refills | Status: DC | PRN

## 2021-08-18 MED ORDER — METOCLOPRAMIDE HCL 10 MG PO TABS: 10.0000 mg | ORAL_TABLET | Freq: Three times a day (TID) | ORAL | 0 refills | Status: DC

## 2021-08-18 MED ORDER — OXYCODONE-ACETAMINOPHEN 5-325 MG PO TABS
1.0000 | ORAL_TABLET | ORAL | Status: DC | PRN
  Administered 2021-08-18: 10:00:00 1 via ORAL
  Filled 2021-08-18: qty 1

## 2021-08-18 NOTE — Discharge Instructions (Signed)
Your labs and CT images overall normal, with exception of a small periumbilical hernia.  The images have been reviewed by the surgeon, and he does not suggest any emergent surgery at this time.  May follow-up with him in the office for ongoing symptoms.  Take the pain medicine as needed antinausea medicine as needed.  Return to the ED for worsening symptoms as discussed.

## 2021-08-18 NOTE — ED Triage Notes (Signed)
Presents with sudden onset of abd pain  states pain started while at work   pt is pacing in lobby  positive nausea

## 2021-08-18 NOTE — ED Provider Notes (Signed)
[facility] Emergency Department Provider Note ____________________________________________  Time seen: 1122  I have reviewed the triage vital signs and the nursing notes.  HISTORY  Chief Complaint  Abdominal Pain   HPI Kelsey Maynard is a 45 y.o. female with below medical history, presents to the ED for evaluation of sudden onset of right upper abdominal pain and cramping.  Patient describes onset while at work.  She also notes some nausea but denies any vomiting, frank fevers, chills, sweats patient denies any diarrhea or constipation. She denies flank pain, hematuria, or dysuria. She is s/p cholecystectomy and has a history of uterine fibroids. She gives a remote history of epiploic appendagitis several months ago.   Past Medical History:  Diagnosis Date   Asthma    Trichimoniasis     There are no problems to display for this patient.   Past Surgical History:  Procedure Laterality Date   CHOLECYSTECTOMY     HERNIA REPAIR      Prior to Admission medications   Medication Sig Start Date End Date Taking? Authorizing Provider  metoCLOPramide (REGLAN) 10 MG tablet Take 1 tablet (10 mg total) by mouth 3 (three) times daily with meals for 10 days. 08/18/21 08/28/21 Yes Brok Stocking, Dannielle Karvonen, PA-C  oxyCODONE-acetaminophen (PERCOCET) 5-325 MG tablet Take 1 tablet by mouth every 8 (eight) hours as needed for up to 3 days for severe pain. 08/18/21 08/21/21 Yes Quintara Bost, Dannielle Karvonen, PA-C  albuterol (VENTOLIN HFA) 108 (90 Base) MCG/ACT inhaler Inhale 1-2 puffs into the lungs every 6 (six) hours as needed for wheezing or shortness of breath. 07/08/21   Tacy Learn, PA-C  doxycycline (VIBRAMYCIN) 100 MG capsule Take 1 capsule (100 mg total) by mouth 2 (two) times daily. 08/06/21   Carvel Getting, NP    Allergies Metronidazole, Prednisone, Sulfamethoxazole-trimethoprim, Vicodin [hydrocodone-acetaminophen], Hydrocodone-acetaminophen, Morphine, and Ondansetron  hcl  History reviewed. No pertinent family history.  Social History Social History   Tobacco Use   Smoking status: Never   Smokeless tobacco: Never  Vaping Use   Vaping Use: Never used  Substance Use Topics   Alcohol use: No   Drug use: No    Review of Systems  Constitutional: Negative for fever. Eyes: Negative for visual changes. ENT: Negative for sore throat. Cardiovascular: Negative for chest pain. Respiratory: Negative for shortness of breath. Gastrointestinal: Positive for RUQ/RLQ abdominal pain. Denies nausea, vomiting and diarrhea. Genitourinary: Negative for dysuria. Musculoskeletal: Negative for back pain. Skin: Negative for rash. Neurological: Negative for headaches, focal weakness or numbness. ____________________________________________  PHYSICAL EXAM:  VITAL SIGNS: ED Triage Vitals  Enc Vitals Group     BP 08/18/21 0934 126/90     Pulse Rate 08/18/21 0934 82     Resp 08/18/21 0934 18     Temp 08/18/21 0934 97.7 F (36.5 C)     Temp Source 08/18/21 0934 Oral     SpO2 08/18/21 0934 99 %     Weight 08/18/21 0927 273 lb 5.9 oz (124 kg)     Height 08/18/21 0927 5\' 8"  (1.727 m)     Head Circumference --      Peak Flow --      Pain Score 08/18/21 0927 10     Pain Loc --      Pain Edu? --      Excl. in [place]? --     Constitutional: Alert and oriented. Well appearing and in no distress. Head: Normocephalic and atraumatic. Eyes: Conjunctivae are normal. Normal  extraocular movements Cardiovascular: Normal rate, regular rhythm. Normal distal pulses. Respiratory: Normal respiratory effort. No wheezes/rales/rhonchi. Gastrointestinal: Soft and mildly tender. No distention, rebound, guarding, or rigidity.  No appreciable periumbilical hernia or defect.  No CVA tenderness noted. Musculoskeletal: Nontender with normal range of motion in all extremities.  Neurologic:  Normal gait without ataxia. Normal speech and language. No gross focal neurologic deficits are  appreciated. Skin:  Skin is warm, dry and intact. No rash noted. Psychiatric: Mood and affect are normal. Patient exhibits appropriate insight and judgment. ____________________________________________    {LABS (pertinent positives/negatives)  Labs Reviewed  CBC WITH DIFFERENTIAL/PLATELET - Abnormal; Notable for the following components:      Result Value   WBC 12.2 (*)    RBC 3.84 (*)    MCV 100.8 (*)    MCH 34.9 (*)    Neutro Abs 10.6 (*)    All other components within normal limits  URINALYSIS, ROUTINE W REFLEX MICROSCOPIC - Abnormal; Notable for the following components:   Color, Urine YELLOW (*)    APPearance HAZY (*)    Specific Gravity, Urine 1.031 (*)    Ketones, ur 20 (*)    Protein, ur 30 (*)    All other components within normal limits  COMPREHENSIVE METABOLIC PANEL  LIPASE, BLOOD  HCG, QUANTITATIVE, PREGNANCY  POC URINE PREG, ED  ____________________________________________  {EKG  ____________________________________________   RADIOLOGY Official radiology report(s): CT ABDOMEN PELVIS WO CONTRAST  Result Date: 08/18/2021 CLINICAL DATA:  Abdominal pain and nausea starting while at work today EXAM: CT ABDOMEN AND PELVIS WITHOUT CONTRAST TECHNIQUE: Multidetector CT imaging of the abdomen and pelvis was performed following the standard protocol without IV contrast. COMPARISON:  Abdominal ultrasound of 08/18/2021 CT abdomen from 08/26/2015 FINDINGS: Lower chest: 4 mm calcified granuloma in the left lower lobe on image 3 series 3. Hepatobiliary: Gallbladder absent. No hypodensity of the hepatic parenchyma to indicate a hepatic steatosis. No significant focal liver lesion is appreciated on today's noncontrast CT examination. Pancreas: Unremarkable Spleen: Small punctate calcifications compatible with old granulomatous disease. Adrenals/Urinary Tract: The adrenal glands appear normal. No hydronephrosis or hydroureter. Scattered punctate vascular calcifications are present in  the anatomic pelvis but urinary tract calculi are identified. Stomach/Bowel: No dilated bowel. Appendix measures in the 6-7 mm diameter range, without periappendiceal inflammatory findings, accordingly no compelling evidence of appendicitis. Scattered sigmoid colon diverticula are present. Vascular/Lymphatic: No substantial degree of atherosclerosis. No pathologic adenopathy. Reproductive: Unremarkable Other: No supplemental non-categorized findings. Musculoskeletal: Mildly multilobulated umbilical hernia noted with new associated fluid density stranding especially in the upper lobulation, for example as seen on image 69 of series 7. This stranding was not present previously and could reflect inflammation of this portion of the small but complex umbilical hernia. Correlate with any tenderness directly in the upper periumbilical region. Roughly stable 2.7 by 1.9 cm oval-shaped density along the right groin region, not appreciably changed from 2016 and likely from prior right groin hernia repair, correlate with operative history. Small central disc protrusion at L5-S1 without observed impingement. IMPRESSION: 1. Mildly complex and multilobulated periumbilical hernia, with new inflammatory stranding in the upper component of this hernia as shown on image 70 of series 7. Correlate with any point tenderness in the umbilical region. Electronically Signed   By: Van Clines M.D.   On: 08/18/2021 14:35   US Abdomen Limited RUQ (LIVER/GB)  Result Date: 08/18/2021 CLINICAL DATA:  Right upper quadrant pain, cholecystectomy EXAM: ULTRASOUND ABDOMEN LIMITED RIGHT UPPER QUADRANT COMPARISON:  CT examination  dated August 26, 2015. FINDINGS: Gallbladder: Surgically removed. Common bile duct: Diameter: 5 mm Liver: No focal lesion identified. Echogenic hepatic parenchyma concerning for steatosis. Portal vein is patent on color Doppler imaging with normal direction of blood flow towards the liver. Other: None. IMPRESSION:  1. Echogenic hepatic parenchyma concerning for hepatic steatosis. No appreciable hepatic mass. 2. Status post cholecystectomy.  No biliary ductal dilatation. Electronically Signed   By: Keane Police D.O.   On: 08/18/2021 13:16   ____________________________________________  PROCEDURES  Percocet 5-325 mg PO  Procedures ____________________________________________   INITIAL IMPRESSION / ASSESSMENT AND PLAN / ED COURSE  As part of my medical decision making, I reviewed the following data within the [place] reviewed as  noted, Radiograph reviewed as noted, Notes from prior ED visits, and [place] Controlled Substance Database    Differential diagnosis includes, but is not limited to, biliary disease (biliary colic, acute cholecystitis, cholangitis, choledocholithiasis, etc), intrathoracic causes for epigastric abdominal pain including ACS, gastritis, duodenitis, pancreatitis, small bowel or large bowel obstruction, abdominal aortic aneurysm, hernia, and ulcer(s).  ----------------------------------------- 3:27 PM on 08/18/2021 ----------------------------------------- S/W Dr. Dahlia Byes: he reviewed the CT scan and does not see any involved bowel. He suggests outpatient pain management, and he can see the patient it the office.   Patient with ED evaluation of sudden onset of right upper and lower quadrant abdominal pain.  Patient presented for evaluation without associated nausea, vomiting, or diarrhea.  Her initial right upper quadrant ultrasound was stable without any acute findings.  Patient was subsequently evaluated CT scan which did confirm some mild periumbilical fat stranding and a complex hernia.  Patient was otherwise stable throughout her course in the ED, had noted improvement of her abdominal pain with oral pain meds.  Patient is overall reassured by the work-up, and has been notified that she can be managed in outpatient setting.  She will follow-up with the general  surgeon for nonemergent management of her periumbilical hernia.  She will be discharged with a prescription for nausea medicine as well as pain medicine at this time.  Return precautions have been discussed.  Work note is provided as requested.  Kelsey Maynard was evaluated in Emergency Department on 08/18/2021 for the symptoms described in the history of present illness. She was evaluated in the context of the global COVID-19 pandemic, which necessitated consideration that the patient might be at risk for infection with the SARS-CoV-2 virus that causes COVID-19. Institutional protocols and algorithms that pertain to the evaluation of patients at risk for COVID-19 are in a state of rapid change based on information released by regulatory bodies including the CDC and federal and state organizations. These policies and algorithms were followed during the patient's care in the ED.  I reviewed the patient's prescription history over the last 12 months in the multi-state controlled substances database(s) that includes [place], [place], [place], [place], [place], [place], [place], [place], [place], [place], [place], [place], [place], and [place].  Results were notable for no RX history. ____________________________________________  FINAL CLINICAL IMPRESSION(S) / ED DIAGNOSES  Final diagnoses:  Right upper quadrant abdominal pain  RUQ abdominal pain  Periumbilical hernia      Carmie End, Dannielle Karvonen, PA-C 08/18/21 1601    Blake Divine, MD 08/18/21 1816

## 2021-08-20 ENCOUNTER — Other Ambulatory Visit: Payer: Self-pay

## 2021-08-20 ENCOUNTER — Encounter (HOSPITAL_COMMUNITY): Payer: Self-pay | Admitting: *Deleted

## 2021-08-20 ENCOUNTER — Ambulatory Visit (HOSPITAL_COMMUNITY)
Admission: EM | Admit: 2021-08-20 | Discharge: 2021-08-20 | Disposition: A | Discharge status: Home / Self Care | Payer: BLUE CROSS/BLUE SHIELD | Attending: Family Medicine | Admitting: Family Medicine

## 2021-08-20 DIAGNOSIS — J020 Streptococcal pharyngitis: Secondary | ICD-10-CM

## 2021-08-20 LAB — POCT RAPID STREP A, ED / UC: Streptococcus, Group A Screen (Direct): POSITIVE — AB

## 2021-08-20 MED ORDER — AMOXICILLIN 500 MG PO CAPS: 500.0000 mg | ORAL_CAPSULE | Freq: Two times a day (BID) | ORAL | 0 refills | Status: AC

## 2021-08-20 MED ORDER — LIDOCAINE VISCOUS HCL 2 % MT SOLN: 15.0000 mL | OROMUCOSAL | 0 refills | Status: DC | PRN

## 2021-08-20 NOTE — ED Triage Notes (Signed)
Pt reports fever started today and also a sore throat.

## 2021-08-20 NOTE — Discharge Instructions (Addendum)
-  Start the antibiotic-Amoxicillin, 1 pill every 12 hours for 10 days.  You can take this with food like with breakfast and dinner. -For sore throat, use lidocaine mouthwash up to every 4 hours. Make sure not to eat for at least 1 hour after using this, as your mouth will be very numb and you could bite yourself. -You can continue tylenol/ibuprofen for discomfort, and make sure to drink plenty of fluids -You'll still be contagious for 24 hours after starting the antibiotic. This means you can go back to work in 1 day.  -Make sure to throw out your toothbrush after 24 hours so you don't give the strep back to yourself.  -Seek additional medical attention if symptoms are getting worse instead of better- trouble swallowing, shortness of breath, voice changes, etc.

## 2021-08-20 NOTE — ED Provider Notes (Signed)
[facility]    CSN: [id number] Arrival date & time: 08/20/21  1906      History   Chief Complaint Chief Complaint  Patient presents with   Fever   Sore Throat    HPI Kelsey Maynard is a 45 y.o. female presenting with sore throat. Medical history noncontributory. Appears that she was last in the ED on 12/20 for RUQ pain, this was determined to be a periumbilical hernia; history prior cholecystectomy. Describes sore throat and congestion. She is concerned for strep, denies known exposure. Denies fevers/chills, n/v/d, shortness of breath, chest pain, cough, congestion, facial pain, teeth pain, headaches, loss of taste/smell, swollen lymph nodes, ear pain.    HPI  Past Medical History:  Diagnosis Date   Asthma    Trichimoniasis     There are no problems to display for this patient.   Past Surgical History:  Procedure Laterality Date   CHOLECYSTECTOMY     HERNIA REPAIR      OB History     Gravida  5   Para  5   Term  5   Preterm      AB      Living  5      SAB      IAB      Ectopic      Multiple      Live Births  5            Home Medications    Prior to Admission medications   Medication Sig Start Date End Date Taking? Authorizing Provider  amoxicillin (AMOXIL) 500 MG capsule Take 1 capsule (500 mg total) by mouth in the morning and at bedtime for 10 days. 08/20/21 08/30/21 Yes Hazel Sams, PA-C  lidocaine (XYLOCAINE) 2 % solution Use as directed 15 mLs in the mouth or throat as needed for mouth pain. 08/20/21  Yes Hazel Sams, PA-C  albuterol (VENTOLIN HFA) 108 (90 Base) MCG/ACT inhaler Inhale 1-2 puffs into the lungs every 6 (six) hours as needed for wheezing or shortness of breath. 07/08/21   Tacy Learn, PA-C  metoCLOPramide (REGLAN) 10 MG tablet Take 1 tablet (10 mg total) by mouth 3 (three) times daily with meals for 10 days. 08/18/21 08/28/21  Menshew, Dannielle Karvonen, PA-C    Family History History reviewed. No  pertinent family history.  Social History Social History   Tobacco Use   Smoking status: Never   Smokeless tobacco: Never  Vaping Use   Vaping Use: Never used  Substance Use Topics   Alcohol use: No   Drug use: No     Allergies   Metronidazole, Prednisone, Sulfamethoxazole-trimethoprim, Vicodin [hydrocodone-acetaminophen], Hydrocodone-acetaminophen, Morphine, and Ondansetron hcl   Review of Systems Review of Systems  Constitutional:  Negative for appetite change, chills and fever.  HENT:  Positive for sore throat. Negative for congestion, ear pain, rhinorrhea, sinus pressure and sinus pain.   Eyes:  Negative for redness and visual disturbance.  Respiratory:  Negative for cough, chest tightness, shortness of breath and wheezing.   Cardiovascular:  Negative for chest pain and palpitations.  Gastrointestinal:  Negative for abdominal pain, constipation, diarrhea, nausea and vomiting.  Genitourinary:  Negative for dysuria, frequency and urgency.  Musculoskeletal:  Negative for myalgias.  Neurological:  Negative for dizziness, weakness and headaches.  Psychiatric/Behavioral:  Negative for confusion.   All other systems reviewed and are negative.   Physical Exam Triage Vital Signs ED Triage Vitals  Enc Vitals Group  BP      Pulse      Resp      Temp      Temp src      SpO2      Weight      Height      Head Circumference      Peak Flow      Pain Score      Pain Loc      Pain Edu?      Excl. in [place]?    No data found.  Updated Vital Signs BP 114/77    Pulse 88    Temp 99.7 F (37.6 C)    Resp 18    LMP 08/17/2021 (Exact Date) Comment: neg preg test 08/18/21   SpO2 99%   Visual Acuity Right Eye Distance:   Left Eye Distance:   Bilateral Distance:    Right Eye Near:   Left Eye Near:    Bilateral Near:     Physical Exam Vitals reviewed.  Constitutional:      General: She is not in acute distress.    Appearance: Normal appearance. She is not ill-appearing.   HENT:     Head: Normocephalic and atraumatic.     Right Ear: Tympanic membrane, ear canal and external ear normal. No tenderness. No middle ear effusion. There is no impacted cerumen. Tympanic membrane is not perforated, erythematous, retracted or bulging.     Left Ear: Tympanic membrane, ear canal and external ear normal. No tenderness.  No middle ear effusion. There is no impacted cerumen. Tympanic membrane is not perforated, erythematous, retracted or bulging.     Nose: Nose normal. No congestion.     Mouth/Throat:     Mouth: Mucous membranes are moist.     Pharynx: Uvula midline. Posterior oropharyngeal erythema present. No oropharyngeal exudate.     Tonsils: Tonsillar exudate present. 1+ on the right. 1+ on the left.     Comments: Tonsils 1+ bilaterally with exudate. On exam, uvula is midline, she is tolerating her secretions without difficulty, there is no trismus, no drooling, she has normal phonation  Eyes:     Extraocular Movements: Extraocular movements intact.     Pupils: Pupils are equal, round, and reactive to light.  Cardiovascular:     Rate and Rhythm: Normal rate and regular rhythm.     Heart sounds: Normal heart sounds.  Pulmonary:     Effort: Pulmonary effort is normal.     Breath sounds: Normal breath sounds. No decreased breath sounds, wheezing, rhonchi or rales.  Abdominal:     Palpations: Abdomen is soft.     Tenderness: There is no abdominal tenderness. There is no guarding or rebound.  Lymphadenopathy:     Cervical: No cervical adenopathy.     Right cervical: No superficial cervical adenopathy.    Left cervical: No superficial cervical adenopathy.  Neurological:     General: No focal deficit present.     Mental Status: She is alert and oriented to person, place, and time.  Psychiatric:        Mood and Affect: Mood normal.        Behavior: Behavior normal.        Thought Content: Thought content normal.        Judgment: Judgment normal.     UC Treatments  / Results  Labs (all labs ordered are listed, but only abnormal results are displayed) Labs Reviewed  POCT RAPID STREP A, ED / UC - Abnormal; Notable  for the following components:      Result Value   Streptococcus, Group A Screen (Direct) POSITIVE (*)    All other components within normal limits    EKG   Radiology No results found.  Procedures Procedures (including critical care time)  Medications Ordered in UC Medications - No data to display  Initial Impression / Assessment and Plan / UC Course  I have reviewed the triage vital signs and the nursing notes.  Pertinent labs & imaging results that were available during my care of the patient were reviewed by me and considered in my medical decision making (see chart for details).     This patient is a very pleasant 45 y.o. year old female presenting with strep pharyngitis. Today this pt is afebrile nontachycardic nontachypneic, oxygenating well on room air, no wheezes rhonchi or rales. U-preg negative 08/18/21.   Rapid strep positive.  Amoxicillin, viscous lidocaine sent.   ED return precautions discussed. Patient verbalizes understanding and agreement.     Final Clinical Impressions(s) / UC Diagnoses   Final diagnoses:  Strep pharyngitis     Discharge Instructions      -Start the antibiotic-Amoxicillin, 1 pill every 12 hours for 10 days.  You can take this with food like with breakfast and dinner. -For sore throat, use lidocaine mouthwash up to every 4 hours. Make sure not to eat for at least 1 hour after using this, as your mouth will be very numb and you could bite yourself. -You can continue tylenol/ibuprofen for discomfort, and make sure to drink plenty of fluids -You'll still be contagious for 24 hours after starting the antibiotic. This means you can go back to work in 1 day.  -Make sure to throw out your toothbrush after 24 hours so you don't give the strep back to yourself.  -Seek additional medical  attention if symptoms are getting worse instead of better- trouble swallowing, shortness of breath, voice changes, etc.      ED Prescriptions     Medication Sig Dispense Auth. Provider   amoxicillin (AMOXIL) 500 MG capsule Take 1 capsule (500 mg total) by mouth in the morning and at bedtime for 10 days. 20 capsule Marin Roberts E, PA-C   lidocaine (XYLOCAINE) 2 % solution Use as directed 15 mLs in the mouth or throat as needed for mouth pain. 100 mL Hazel Sams, PA-C      PDMP not reviewed this encounter.   Hazel Sams, PA-C 08/20/21 [id number]

## 2021-09-12 ENCOUNTER — Ambulatory Visit (HOSPITAL_COMMUNITY)
Admission: EM | Admit: 2021-09-12 | Discharge: 2021-09-12 | Disposition: A | Discharge status: Home / Self Care | Payer: BLUE CROSS/BLUE SHIELD

## 2021-09-12 ENCOUNTER — Other Ambulatory Visit: Payer: Self-pay

## 2021-09-12 ENCOUNTER — Encounter (HOSPITAL_COMMUNITY): Payer: Self-pay

## 2021-09-12 DIAGNOSIS — R110 Nausea: Secondary | ICD-10-CM | POA: Diagnosis not present

## 2021-09-12 DIAGNOSIS — Z3201 Encounter for pregnancy test, result positive: Secondary | ICD-10-CM

## 2021-09-12 LAB — POC URINE PREG, ED: Preg Test, Ur: POSITIVE — AB

## 2021-09-12 NOTE — Discharge Instructions (Signed)
You had a positive pregnancy test today.  Please call the OB/GYN and schedule appointment soon as possible.  It is important that you avoid over-the-counter medications with the exception of Tylenol.  Make sure you drink plenty of fluid and eat small frequent meals.  If you have any abdominal cramping or pelvic bleeding you need to go to the emergency room immediately.  Start a prenatal vitamin which is available over-the-counter.  As we discussed, you should avoid raw food make sure not to raise body temperature by avoiding saunas and hot tubs.

## 2021-09-12 NOTE — ED Triage Notes (Signed)
Pt states that she has had nausea for 1 month. Pt states she has not had a normal period since before Christmas and states that the last period she had ws very light pink. G6 P6 A0. Pt has not done a pregnancy test. Pt also reports feeling very tired.

## 2021-09-12 NOTE — ED Provider Notes (Signed)
[facility]    CSN: [id number] Arrival date & time: 09/12/21  1731      History   Chief Complaint Chief Complaint  Patient presents with   Nausea    HPI Kelsey Maynard is a 46 y.o. female.   Patient presents today with a 1 month history of increased nausea.  She reports her last menstrual cycle was 07/26/2021 but she did have some light spotting around Christmas time and has not had any additional bleeding since then.  She is not currently on birth control but does typically use protection.  She does report some breast tenderness as well as fatigue.  She denies any chest pain, shortness of breath, abdominal pain, vomiting, diarrhea.   Past Medical History:  Diagnosis Date   Asthma    Trichimoniasis     There are no problems to display for this patient.   Past Surgical History:  Procedure Laterality Date   CHOLECYSTECTOMY     HERNIA REPAIR      OB History     Gravida  5   Para  5   Term  5   Preterm      AB      Living  5      SAB      IAB      Ectopic      Multiple      Live Births  5            Home Medications    Prior to Admission medications   Medication Sig Start Date End Date Taking? Authorizing Provider  albuterol (VENTOLIN HFA) 108 (90 Base) MCG/ACT inhaler Inhale 1-2 puffs into the lungs every 6 (six) hours as needed for wheezing or shortness of breath. 07/08/21   Tacy Learn, PA-C    Family History History reviewed. No pertinent family history.  Social History Social History   Tobacco Use   Smoking status: Never   Smokeless tobacco: Never  Vaping Use   Vaping Use: Never used  Substance Use Topics   Alcohol use: No   Drug use: No     Allergies   Metronidazole, Prednisone, Sulfamethoxazole-trimethoprim, Vicodin [hydrocodone-acetaminophen], Hydrocodone-acetaminophen, Morphine, and Ondansetron hcl   Review of Systems Review of Systems  Constitutional:  Positive for activity change and appetite change.  Negative for fatigue and fever.  Respiratory:  Negative for cough and shortness of breath.   Cardiovascular:  Negative for chest pain.  Gastrointestinal:  Positive for nausea. Negative for abdominal pain, diarrhea and vomiting.  Genitourinary:  Positive for menstrual problem. Negative for vaginal bleeding, vaginal discharge and vaginal pain.  Neurological:  Negative for dizziness, light-headedness and headaches.    Physical Exam Triage Vital Signs ED Triage Vitals  Enc Vitals Group     BP 09/12/21 1804 (!) 144/98     Pulse --      Resp 09/12/21 1804 18     Temp 09/12/21 1804 98.1 F (36.7 C)     Temp Source 09/12/21 1804 Oral     SpO2 09/12/21 1804 97 %     Weight --      Height --      Head Circumference --      Peak Flow --      Pain Score 09/12/21 1805 0     Pain Loc --      Pain Edu? --      Excl. in [place]? --    No data found.  Updated Vital Signs BP Marland Kitchen)  144/98 (BP Location: Right Arm)    Temp 98.1 F (36.7 C) (Oral)    Resp 18    LMP  (LMP Unknown) Comment: neg preg test 08/18/21   SpO2 97%   Visual Acuity Right Eye Distance:   Left Eye Distance:   Bilateral Distance:    Right Eye Near:   Left Eye Near:    Bilateral Near:     Physical Exam Vitals reviewed.  Constitutional:      General: She is awake. She is not in acute distress.    Appearance: Normal appearance. She is not ill-appearing.  HENT:     Head: Normocephalic and atraumatic.  Cardiovascular:     Rate and Rhythm: Normal rate and regular rhythm.     Heart sounds: No murmur heard. Pulmonary:     Effort: Pulmonary effort is normal.     Breath sounds: Normal breath sounds. No wheezing, rhonchi or rales.  Abdominal:     General: Bowel sounds are normal.     Palpations: Abdomen is soft.     Tenderness: There is no abdominal tenderness. There is no right CVA tenderness, left CVA tenderness, guarding or rebound.  Musculoskeletal:     Right lower leg: No edema.     Left lower leg: No edema.   Psychiatric:        Behavior: Behavior is cooperative.     UC Treatments / Results  Labs (all labs ordered are listed, but only abnormal results are displayed) Labs Reviewed  POC URINE PREG, ED - Abnormal; Notable for the following components:      Result Value   Preg Test, Ur POSITIVE (*)    All other components within normal limits    EKG   Radiology No results found.  Procedures Procedures (including critical care time)  Medications Ordered in UC Medications - No data to display  Initial Impression / Assessment and Plan / UC Course  I have reviewed the triage vital signs and the nursing notes.  Pertinent labs & imaging results that were available during my care of the patient were reviewed by me and considered in my medical decision making (see chart for details).     Patient had positive pregnancy test in clinic today.  Discussed that she should avoid any over-the-counter medications with the exception of Tylenol.  She is to start prenatal vitamin.  She does not take prescription medication on a regular basis with exception of albuterol.  Discussed that she should eat only cooked food and avoid unheated lunchmeat.  She is to be small frequent meals and drink plenty of fluid.  Recommended she follow-up with OB/GYN as soon as possible and was given contact information for local provider.  Discussed that if she has any worsening symptoms including chest pain, shortness of breath, vaginal bleeding, abdominal pain she needs to go to the emergency room.  All questions answered to patient satisfaction.  Final Clinical Impressions(s) / UC Diagnoses   Final diagnoses:  Positive pregnancy test  Nausea     Discharge Instructions      You had a positive pregnancy test today.  Please call the OB/GYN and schedule appointment soon as possible.  It is important that you avoid over-the-counter medications with the exception of Tylenol.  Make sure you drink plenty of fluid and eat  small frequent meals.  If you have any abdominal cramping or pelvic bleeding you need to go to the emergency room immediately.  Start a prenatal vitamin which is available over-the-counter.  As we discussed, you should avoid raw food make sure not to raise body temperature by avoiding saunas and hot tubs.     ED Prescriptions   None    PDMP not reviewed this encounter.   Terrilee Croak, PA-C 09/12/21 1817

## 2021-11-22 ENCOUNTER — Emergency Department (HOSPITAL_COMMUNITY)
Admission: EM | Admit: 2021-11-22 | Discharge: 2021-11-23 | Discharge status: Against Medical Advice | Payer: BLUE CROSS/BLUE SHIELD | Attending: Emergency Medicine | Admitting: Emergency Medicine

## 2021-11-22 DIAGNOSIS — Z20822 Contact with and (suspected) exposure to covid-19: Secondary | ICD-10-CM | POA: Diagnosis not present

## 2021-11-22 DIAGNOSIS — Z5321 Procedure and treatment not carried out due to patient leaving prior to being seen by health care provider: Secondary | ICD-10-CM | POA: Diagnosis not present

## 2021-11-22 DIAGNOSIS — R051 Acute cough: Secondary | ICD-10-CM | POA: Diagnosis present

## 2021-11-23 ENCOUNTER — Encounter (HOSPITAL_COMMUNITY): Payer: Self-pay | Admitting: Radiology

## 2021-11-23 ENCOUNTER — Encounter (HOSPITAL_COMMUNITY): Payer: Self-pay | Admitting: *Deleted

## 2021-11-23 ENCOUNTER — Ambulatory Visit (HOSPITAL_COMMUNITY)
Admission: EM | Admit: 2021-11-23 | Discharge: 2021-11-23 | Disposition: A | Discharge status: Home / Self Care | Payer: BLUE CROSS/BLUE SHIELD | Attending: Family Medicine | Admitting: Family Medicine

## 2021-11-23 ENCOUNTER — Emergency Department (HOSPITAL_COMMUNITY): Payer: BLUE CROSS/BLUE SHIELD

## 2021-11-23 ENCOUNTER — Other Ambulatory Visit: Payer: Self-pay

## 2021-11-23 DIAGNOSIS — J4521 Mild intermittent asthma with (acute) exacerbation: Secondary | ICD-10-CM

## 2021-11-23 DIAGNOSIS — J069 Acute upper respiratory infection, unspecified: Secondary | ICD-10-CM

## 2021-11-23 DIAGNOSIS — R051 Acute cough: Secondary | ICD-10-CM

## 2021-11-23 LAB — RESP PANEL BY RT-PCR (FLU A&B, COVID) ARPGX2
Influenza A by PCR: NEGATIVE
Influenza B by PCR: NEGATIVE
SARS Coronavirus 2 by RT PCR: NEGATIVE

## 2021-11-23 LAB — GROUP A STREP BY PCR: Group A Strep by PCR: NOT DETECTED

## 2021-11-23 MED ORDER — DEXTROMETHORPHAN POLISTIREX ER 30 MG/5ML PO SUER: 30.0000 mg | Freq: Every evening | ORAL | 0 refills | Status: AC | PRN

## 2021-11-23 MED ORDER — ACETAMINOPHEN 325 MG PO TABS
650.0000 mg | ORAL_TABLET | Freq: Once | ORAL | Status: AC | PRN
  Administered 2021-11-23: 650 mg via ORAL
  Filled 2021-11-23: qty 2

## 2021-11-23 NOTE — Discharge Instructions (Addendum)
Take medication as prescribed.Start using your inhaler, you have refills at your pharmacy. ?As discussed, your chest x-ray, influenza/COVID swab were negative. ?Increase rest and get plenty of fluids. ?Also recommend using a humidifier at nighttime during sleep.  Sleep elevated on 2 pillows. ?Follow-up if symptoms worsen or do not improve. ?

## 2021-11-23 NOTE — ED Notes (Signed)
Pt left while waiting for a room.  ?

## 2021-11-23 NOTE — ED Triage Notes (Signed)
Pt arrives to ED POV c/o Cough x3 days. ?

## 2021-11-23 NOTE — ED Provider Notes (Signed)
?[facility] ? ? ? ?CSN: [id number] ?Arrival date & time: 11/23/21  9150 ? ? ?  ? ?History   ?Chief Complaint ?Chief Complaint  ?Patient presents with  ? Cough  ? ? ?HPI ?Kelsey Maynard is a 46 y.o. female.  ? ?The patient is a 46 year old female who presents for cough over the past week.  She states that she started with fever, which she had on yesterday, fatigue, and postnasal drainage.  Today she states that the cough is continuing to worsen, which wakes her up at night.  She states the cough also is so bad that she has thrown up with it.  She does have a history of asthma, she reports some shortness of breath after a coughing episode.  She informs the cough is sometimes productive of clear sputum, and sometimes dry.  Today she denies fever, chills, nasal congestion, runny nose, headache.  She states she went to the ER 1 day ago, and a chest x-ray and influenza/COVID swabs were performed.  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 Patient states that she has been using her inhaler for her symptoms.  She is [redacted] weeks pregnant. ? ?The history is provided by the patient.  ? ?Past Medical History:  ?Diagnosis Date  ? Asthma   ? Trichimoniasis   ? ? ?There are no problems to display for this patient. ? ? ?Past Surgical History:  ?Procedure Laterality Date  ? CHOLECYSTECTOMY    ? HERNIA REPAIR    ? ? ?OB History   ? ? Gravida  ?6  ? Para  ?5  ? Term  ?5  ? Preterm  ?   ? AB  ?   ? Living  ?5  ?  ? ? SAB  ?   ? IAB  ?   ? Ectopic  ?   ? Multiple  ?   ? Live Births  ?5  ?   ?  ?  ? ? ? ?Home Medications   ? ?Prior to Admission medications   ?Medication Sig Start Date End Date Taking? Authorizing Provider  ?albuterol (VENTOLIN HFA) 108 (90 Base) MCG/ACT inhaler Inhale 1-2 puffs into the lungs every 6 (six) hours as needed for wheezing or shortness of breath. 07/08/21   Tacy Learn, PA-C  ? ? ?Family History ?History reviewed. No pertinent family history. ? ?Social History ?Social History  ? ?Tobacco Use  ? Smoking  status: Never  ? Smokeless tobacco: Never  ?Vaping Use  ? Vaping Use: Never used  ?Substance Use Topics  ? Alcohol use: No  ? Drug use: No  ? ? ? ?Allergies   ?Metronidazole, Prednisone, Sulfamethoxazole-trimethoprim, Vicodin [hydrocodone-acetaminophen], Hydrocodone-acetaminophen, Morphine, and Ondansetron hcl ? ? ?Review of Systems ?Review of Systems  ?Constitutional:  Positive for activity change, appetite change, fatigue and fever.  ?HENT:  Positive for postnasal drip. Negative for congestion, rhinorrhea, sinus pressure and sinus pain.   ?Eyes: Negative.   ?Respiratory:  Positive for cough, shortness of breath and wheezing.   ?Cardiovascular: Negative.   ?Gastrointestinal: Negative.   ?Skin: Negative.   ?Psychiatric/Behavioral: Negative.    ? ? ?Physical Exam ?Triage Vital Signs ?ED Triage Vitals  ?Enc Vitals Group  ?   BP 11/23/21 0944 102/74  ?   Pulse Rate 11/23/21 0944 82  ?   Resp 11/23/21 0944 18  ?   Temp 11/23/21 0944 99.5 ?F (37.5 ?C)  ?   Temp src --   ?   SpO2 11/23/21  0944 97 %  ?   Weight --   ?   Height --   ?   Head Circumference --   ?   Peak Flow --   ?   Pain Score 11/23/21 0942 5  ?   Pain Loc --   ?   Pain Edu? --   ?   Excl. in [place]? --   ? ?No data found. ? ?Updated Vital Signs ?BP 102/74   Pulse 82   Temp 99.5 ?F (37.5 ?C)   Resp 18   LMP  (LMP Unknown) Comment: neg preg test 08/18/21  SpO2 97%  ? ?Visual Acuity ?Right Eye Distance:   ?Left Eye Distance:   ?Bilateral Distance:   ? ?Right Eye Near:   ?Left Eye Near:    ?Bilateral Near:    ? ?Physical Exam ?Vitals reviewed.  ?Constitutional:   ?   General: She is not in acute distress. ?   Appearance: Normal appearance.  ?HENT:  ?   Head: Normocephalic and atraumatic.  ?   Right Ear: Tympanic membrane, ear canal and external ear normal.  ?   Left Ear: Tympanic membrane, ear canal and external ear normal.  ?   Nose: No congestion or rhinorrhea.  ?   Mouth/Throat:  ?   Mouth: Mucous membranes are moist.  ?Eyes:  ?   Extraocular Movements:  Extraocular movements intact.  ?   Conjunctiva/sclera: Conjunctivae normal.  ?   Pupils: Pupils are equal, round, and reactive to light.  ?Cardiovascular:  ?   Rate and Rhythm: Normal rate and regular rhythm.  ?   Pulses: Normal pulses.  ?   Heart sounds: Normal heart sounds.  ?Pulmonary:  ?   Effort: Pulmonary effort is normal. No respiratory distress.  ?   Breath sounds: Normal breath sounds. No wheezing or rales.  ?Abdominal:  ?   General: Bowel sounds are normal.  ?   Palpations: Abdomen is soft.  ?Musculoskeletal:  ?   Cervical back: Normal range of motion and neck supple.  ?Lymphadenopathy:  ?   Cervical: No cervical adenopathy.  ?Skin: ?   General: Skin is warm and dry.  ?Neurological:  ?   Mental Status: She is alert and oriented to person, place, and time.  ?Psychiatric:     ?   Mood and Affect: Mood normal.     ?   Behavior: Behavior normal.  ? ? ? ?UC Treatments / Results  ?Labs ?(all labs ordered are listed, but only abnormal results are displayed) ?Labs Reviewed - No data to display ? ?EKG ? ? ?Radiology ?DG Chest 2 View ? ?Result Date: 11/23/2021 ?CLINICAL DATA:  Cough and chest tightness. EXAM: CHEST - 2 VIEW COMPARISON:  June 30, 2018 FINDINGS: The heart size and mediastinal contours are within normal limits. Stable, calcified subcarinal lymph nodes are seen. Both lungs are clear. Radiopaque surgical clips are seen within the right upper quadrant. Multiple chronic left-sided rib fractures are noted. IMPRESSION: No active cardiopulmonary disease. Electronically Signed   By: Virgina Norfolk M.D.   On: 11/23/2021 00:35   ? ?Procedures ?Procedures (including critical care time) ? ?Medications Ordered in UC ?Medications - No data to display ? ?Initial Impression / Assessment and Plan / UC Course  ?I have reviewed the triage vital signs and the nursing notes. ? ?Pertinent labs & imaging results that were available during my care of the patient were reviewed by me and considered in my medical decision  making (see  chart for details). ? ?The patient is a 46 year old female who presents with cough over the past week.  Patient states symptoms have worsened over the past 24 to 48 hours.  She went to the ER 1 day ago, and influenza/COVID swab and chest x-ray were performed.  All testing was negative.  Patient has also had intermittent fever.  The patient is [redacted] weeks pregnant.  She is also allergic to prednisone.  Based on her symptoms, and her pregnancy, I am going to prescribe  Delsym for her cough, she has refills for her albuterol inhaler at her pharmacy.  [employer] was reviewed which showed that cough medicine was safe for pregnancy.  We will also encourage patient to perform supportive care to include increasing fluids, getting plenty of rest, and using a humidifier at home.  Explained to the patient due to her pregnancy, and allergies, will provide the safest medications for her at this time.  Patient encouraged to follow-up if symptoms worsen or do not improve. ?Final Clinical Impressions(s) / UC Diagnoses  ? ?Final diagnoses:  ?None  ? ?Discharge Instructions   ?None ?  ? ?ED Prescriptions   ?None ?  ? ?PDMP not reviewed this encounter. ?  ?Tish Men, NP ?11/23/21 1018 ? ?

## 2021-11-23 NOTE — ED Triage Notes (Signed)
PT reports a real bad cough that keeps her awake at night. Pt went to ED and had x-ray and swabs done. Pt left before being seen and comes to [facility] today for follow up. ?

## 2021-12-24 ENCOUNTER — Telehealth: Payer: Self-pay | Admitting: Emergency Medicine

## 2021-12-24 ENCOUNTER — Ambulatory Visit
Admission: EM | Admit: 2021-12-24 | Discharge: 2021-12-24 | Disposition: A | Discharge status: Home / Self Care | Payer: BLUE CROSS/BLUE SHIELD | Attending: Emergency Medicine | Admitting: Emergency Medicine

## 2021-12-24 ENCOUNTER — Encounter: Payer: Self-pay | Admitting: Emergency Medicine

## 2021-12-24 DIAGNOSIS — J020 Streptococcal pharyngitis: Secondary | ICD-10-CM | POA: Diagnosis present

## 2021-12-24 LAB — GROUP A STREP BY PCR: Group A Strep by PCR: DETECTED — AB

## 2021-12-24 MED ORDER — CEFDINIR 300 MG PO CAPS: 300.0000 mg | ORAL_CAPSULE | Freq: Two times a day (BID) | ORAL | 0 refills | Status: AC

## 2021-12-24 MED ORDER — CEPHALEXIN 500 MG PO CAPS: 500.0000 mg | ORAL_CAPSULE | Freq: Two times a day (BID) | ORAL | 0 refills | Status: DC

## 2021-12-24 MED ORDER — CEFDINIR 300 MG PO CAPS: 300.0000 mg | ORAL_CAPSULE | Freq: Two times a day (BID) | ORAL | 0 refills | Status: DC

## 2021-12-24 NOTE — ED Provider Notes (Signed)
?HPI ? ?SUBJECTIVE: ? ?Patient reports sore throat, bilateral ear starting 3 days ago.  Sx worse with swallowing, talking.  Sx better with nothing. Has been taking leftover amoxicillin for the past 6 days, Tylenol and Sudafed w/ o relief.  ?No fever ?+ Swollen neck glands   ?No neck stiffness  ?No Cough ?No nasal congestion, rhinorrhea ?No Myalgias ?No Headache ?No Rash ? ?No loss of taste or smell ?No shortness of breath or difficulty breathing ?No nausea, vomiting ?No diarrhea ?No abdominal pain ?    ?No Recent Strep, mono, COVID, flu exposure ?She got the second dose of the COVID-vaccine.  Did not get the flu vaccine. ?No reflux sxs ?No Allergy sxs ? ?No Breathing difficulty, muffled voice, sensation of throat swelling shut ?+ States voice sounds "congestion" ?No Drooling ?No Trismus ?+ abx in past month-  was diagnosed with strep throat 2 to 3 weeks ago, took the amoxicillin for 4 days, but discontinued it secondary to nausea.  She restarted it 6 days ago and finished it today. ?No antipyretic in past 4-6 hrs ?She has a past medical history of asthma, remote history of mono.  No history of GERD. ?She is currently 19 5/[redacted] weeks pregnant.  She is getting prenatal care and has had an ultrasound confirming the location of the pregnancy.  She states that she is feeling baby move.  No contractions, leakage of fluid, passage of tissue, vaginal bleeding ?PCP: Cannot remember ? ? ?Past Medical History:  ?Diagnosis Date  ? Asthma   ? Trichimoniasis   ? ? ?Past Surgical History:  ?Procedure Laterality Date  ? CHOLECYSTECTOMY    ? HERNIA REPAIR    ? ? ?History reviewed. No pertinent family history. ? ?Social History  ? ?Tobacco Use  ? Smoking status: Never  ? Smokeless tobacco: Never  ?Vaping Use  ? Vaping Use: Never used  ?Substance Use Topics  ? Alcohol use: No  ? Drug use: No  ? ? ?No current facility-administered medications for this encounter. ? ?Current Outpatient Medications:  ?  cefdinir (OMNICEF) 300 MG capsule,  Take 1 capsule (300 mg total) by mouth 2 (two) times daily for 7 days., Disp: 14 capsule, Rfl: 0 ?  albuterol (VENTOLIN HFA) 108 (90 Base) MCG/ACT inhaler, Inhale 1-2 puffs into the lungs every 6 (six) hours as needed for wheezing or shortness of breath., Disp: 18 g, Rfl: 6 ? ?Allergies  ?Allergen Reactions  ? Metronidazole Hives, Itching and Swelling  ? Prednisone Anaphylaxis, Hives and Nausea And Vomiting  ? Sulfamethoxazole-Trimethoprim Swelling, Other (See Comments) and Hives  ?  Reaction:  Facial swelling  ? Vicodin [Hydrocodone-Acetaminophen] Hives and Itching  ? Hydrocodone-Acetaminophen Rash  ? Morphine Rash  ? Ondansetron Hcl Swelling, Other (See Comments) and Rash  ?  Reaction:  Facial swelling  ? ? ? ?ROS ? ?As noted in HPI.  ? ?Physical Exam ? ?BP 127/82 (BP Location: Right Arm)   Pulse 71   Temp 99.6 ?F (37.6 ?C) (Oral)   Resp 16   Ht '5\' 8"'$  (1.727 m)   Wt 124 kg   LMP  (LMP Unknown) Comment: neg preg test 08/18/21  SpO2 98%   BMI 41.57 kg/m?  ? ?Constitutional: Well developed, well nourished, no acute distress ?Eyes:  EOMI, conjunctiva normal bilaterally ?HENT: Normocephalic, atraumatic,mucus membranes moist.  No nasal congestion.  Erythematous, swollen tonsils, worse on the right.  No exudates.  Uvula also erythematous, but not swollen.  Uvula midline.  No drooling, trismus, muffled voice. ?  Respiratory: Normal inspiratory effort ?Cardiovascular: Normal rate, no murmurs, rubs, gallops ?GI: nondistended, nontender. No appreciable splenomegaly.  Fundus size consistent with dates. ?skin: No rash, skin intact ?Lymph: Positive anterior cervical LN.  No posterior cervical lymphadenopathy ?Musculoskeletal: no deformities ?Neurologic: Alert & oriented x 3, no focal neuro deficits ?Psychiatric: Speech and behavior appropriate.  ? ?ED Course ? ? ?Medications - No data to display ? ?Orders Placed This Encounter  ?Procedures  ? Group A Strep by PCR  ?  Standing Status:   Standing  ?  Number of Occurrences:    1  ? ? ?Results for orders placed or performed during the hospital encounter of 12/24/21 (from the past 24 hour(s))  ?Group A Strep by PCR     Status: Abnormal  ? Collection Time: 12/24/21  4:59 PM  ? Specimen: Throat; Sterile Swab  ?Result Value Ref Range  ? Group A Strep by PCR DETECTED (A) NOT DETECTED  ? ?No results found. ? ?ED Clinical Impression ? ?1. Strep pharyngitis   ? ? ? ?ED Assessment/Plan ? ?Strep PCR positive.  Sending home with Omnicef 300 mg twice daily for 7 days since amoxicillin did not work. Home with Tylenol, Benadryl/Maalox mixture. Patient to followup with PMD when necessary. ? ? ?Discussed labs,  MDM, plan and followup with patient. Discussed sn/sx that should prompt return to the ED. patient agrees with plan.  ? ?Meds ordered this encounter  ?Medications  ? DISCONTD: cephALEXin (KEFLEX) 500 MG capsule  ?  Sig: Take 1 capsule (500 mg total) by mouth 2 (two) times daily for 10 days.  ?  Dispense:  20 capsule  ?  Refill:  0  ? cefdinir (OMNICEF) 300 MG capsule  ?  Sig: Take 1 capsule (300 mg total) by mouth 2 (two) times daily for 7 days.  ?  Dispense:  14 capsule  ?  Refill:  0  ? ? ? ?*This clinic note was created using [employer]. Therefore, there may be occasional mistakes despite careful proofreading. ?  ?  ?Melynda Ripple, MD ?12/24/21 1802 ? ?

## 2021-12-24 NOTE — Discharge Instructions (Addendum)
Finish the [place], even if you feel better.  1 gram of Tylenol 3-4 times a day as needed for pain.  Make sure you drink plenty of extra fluids.  Some people find salt water gargles and  Tr[employer]'s "Throat Coat" tea helpful. Take 5 mL of liquid Benadryl and 5 mL of Maalox. Mix it together, and then hold it in your mouth for as long as you can and then swallow. You may do this 4 times a day.   ? ?Go to www.goodrx.com  or www.costplusdrugs.com to look up your medications. This will give you a list of where you can find your prescriptions at the most affordable prices. Or ask the pharmacist what the cash price is, or if they have any other discount programs available to help make your medication more affordable. This can be less expensive than what you would pay with insurance.   ?

## 2021-12-24 NOTE — Telephone Encounter (Signed)
Patient requesting medication be switched to a pharmacy in [place].  ?

## 2021-12-24 NOTE — ED Triage Notes (Signed)
Pt c/o bilateral ear pain, and sore throat. Started about 3 days ago. She has had a subjective fever. Pt states she strep 2 weeks ago and stopped her antibiotic because it was making her nauseous. Pt started it back last week and finished it today.  ?

## 2021-12-31 ENCOUNTER — Emergency Department: Payer: BLUE CROSS/BLUE SHIELD

## 2021-12-31 ENCOUNTER — Emergency Department
Admission: EM | Admit: 2021-12-31 | Discharge: 2022-01-01 | Disposition: A | Discharge status: Home / Self Care | Payer: BLUE CROSS/BLUE SHIELD | Attending: Emergency Medicine | Admitting: Emergency Medicine

## 2021-12-31 ENCOUNTER — Other Ambulatory Visit: Payer: Self-pay

## 2021-12-31 DIAGNOSIS — J45909 Unspecified asthma, uncomplicated: Secondary | ICD-10-CM | POA: Diagnosis not present

## 2021-12-31 DIAGNOSIS — R1033 Periumbilical pain: Secondary | ICD-10-CM | POA: Diagnosis present

## 2021-12-31 DIAGNOSIS — K429 Umbilical hernia without obstruction or gangrene: Secondary | ICD-10-CM

## 2021-12-31 LAB — URINALYSIS, ROUTINE W REFLEX MICROSCOPIC
Bilirubin Urine: NEGATIVE
Glucose, UA: NEGATIVE mg/dL
Hgb urine dipstick: NEGATIVE
Ketones, ur: 5 mg/dL — AB
Leukocytes,Ua: NEGATIVE
Nitrite: NEGATIVE
Protein, ur: NEGATIVE mg/dL
Specific Gravity, Urine: 1.019 (ref 1.005–1.030)
pH: 5 (ref 5.0–8.0)

## 2021-12-31 LAB — COMPREHENSIVE METABOLIC PANEL
ALT: 15 U/L (ref 0–44)
AST: 26 U/L (ref 15–41)
Albumin: 4.2 g/dL (ref 3.5–5.0)
Alkaline Phosphatase: 64 U/L (ref 38–126)
Anion gap: 9 (ref 5–15)
BUN: 13 mg/dL (ref 6–20)
CO2: 23 mmol/L (ref 22–32)
Calcium: 9.2 mg/dL (ref 8.9–10.3)
Chloride: 104 mmol/L (ref 98–111)
Creatinine, Ser: 0.91 mg/dL (ref 0.44–1.00)
GFR, Estimated: >60 mL/min (ref >60)
Glucose, Bld: 94 mg/dL (ref 70–99)
Potassium: 3.9 mmol/L (ref 3.5–5.1)
Sodium: 136 mmol/L (ref 135–145)
Total Bilirubin: 0.8 mg/dL (ref 0.3–1.2)
Total Protein: 7.9 g/dL (ref 6.5–8.1)

## 2021-12-31 LAB — CBC WITH DIFFERENTIAL/PLATELET
Abs Immature Granulocytes: 0.03 10*3/uL (ref 0.00–0.07)
Basophils Absolute: 0 10*3/uL (ref 0.0–0.1)
Basophils Relative: 1 %
Eosinophils Absolute: 0.1 10*3/uL (ref 0.0–0.5)
Eosinophils Relative: 1 %
HCT: 40.7 % (ref 36.0–46.0)
Hemoglobin: 13.2 g/dL (ref 12.0–15.0)
Immature Granulocytes: 0 %
Lymphocytes Relative: 30 %
Lymphs Abs: 2.4 10*3/uL (ref 0.7–4.0)
MCH: 33.2 pg (ref 26.0–34.0)
MCHC: 32.4 g/dL (ref 30.0–36.0)
MCV: 102.3 fL — ABNORMAL HIGH (ref 80.0–100.0)
Monocytes Absolute: 0.6 10*3/uL (ref 0.1–1.0)
Monocytes Relative: 8 %
Neutro Abs: 4.8 10*3/uL (ref 1.7–7.7)
Neutrophils Relative %: 60 %
Platelets: 298 10*3/uL (ref 150–400)
RBC: 3.98 MIL/uL (ref 3.87–5.11)
RDW: 12.5 % (ref 11.5–15.5)
WBC: 8 10*3/uL (ref 4.0–10.5)
nRBC: 0 % (ref 0.0–0.2)

## 2021-12-31 LAB — POC URINE PREG, ED
Preg Test, Ur: NEGATIVE
Preg Test, Ur: NEGATIVE

## 2021-12-31 LAB — PREGNANCY, URINE: Preg Test, Ur: NEGATIVE

## 2021-12-31 MED ORDER — IOHEXOL 300 MG/ML  SOLN
100.0000 mL | Freq: Once | INTRAMUSCULAR | Status: AC | PRN
  Administered 2021-12-31: 100 mL via INTRAVENOUS

## 2021-12-31 MED ORDER — HYDROMORPHONE HCL 1 MG/ML IJ SOLN
1.0000 mg | Freq: Once | INTRAMUSCULAR | Status: AC
  Administered 2022-01-01: 1 mg via INTRAVENOUS
  Filled 2021-12-31: qty 1

## 2021-12-31 MED ORDER — KETOROLAC TROMETHAMINE 15 MG/ML IJ SOLN
15.0000 mg | Freq: Once | INTRAMUSCULAR | Status: AC
  Administered 2021-12-31: 15 mg via INTRAVENOUS
  Filled 2021-12-31: qty 1

## 2021-12-31 NOTE — ED Triage Notes (Signed)
Pt states she started having right flank pain about an hour ago. Pt denies pain elsewhere, pt denies urinary symptoms.  ?

## 2021-12-31 NOTE — ED Provider Triage Note (Signed)
?  Emergency Medicine Provider Triage Evaluation Note ? ?Kelsey Maynard , a 46 y.o.female,  was evaluated in triage.  Pt complains of abdominal pain.  She reports a sudden onset of abdominal pain that radiates from her bellybutton to her right lower quadrant and wraps around her back.  No urinary symptoms at this time. ? ? ?Review of Systems  ?Positive: Abdominal pain. ?Negative: Denies fever, chest pain, vomiting ? ?Physical Exam  ? ?Vitals:  ? 12/31/21 1802 12/31/21 1802  ?BP: (!) 155/102 (!) 155/102  ?Pulse: (!) 105 (!) 105  ?Resp: 20 20  ?Temp: 98.6 ?F (37 ?C) 98.6 ?F (37 ?C)  ?SpO2: 100% 97%  ? ?Gen:   Awake, anxious and in pain ?Resp:  Normal effort  ?MSK:   Moves extremities without difficulty  ?Other:  Tenderness when palpating the right lower quadrant. ? ?Medical Decision Making  ?Given the patient's initial medical screening exam, the following diagnostic evaluation has been ordered. The patient will be placed in the appropriate treatment space, once one is available, to complete the evaluation and treatment. I have discussed the plan of care with the patient and I have advised the patient that an ED physician or mid-level practitioner will reevaluate their condition after the test results have been received, as the results may give them additional insight into the type of treatment they may need.  ? ? ?Diagnostics: Abdominal CT/pelvis, labs, UA ? ?Treatments: Dilaudid ?  ?Teodoro Spray, [place] ?12/31/21 1807 ? ?

## 2022-01-01 MED ORDER — OXYCODONE-ACETAMINOPHEN 5-325 MG PO TABS: 1.0000 | ORAL_TABLET | Freq: Four times a day (QID) | ORAL | 0 refills | Status: DC | PRN

## 2022-01-01 MED ORDER — PROMETHAZINE HCL 25 MG PO TABS: 25.0000 mg | ORAL_TABLET | Freq: Four times a day (QID) | ORAL | 0 refills | Status: DC | PRN

## 2022-01-01 NOTE — ED Notes (Signed)
Discharge instructions including prescription and pain management discussed with pt. Pt verbalized understanding with no questions at this time. Pt to go home with cousin who is waiting in lobby.  ?

## 2022-01-01 NOTE — ED Provider Notes (Signed)
? ?[facility] ?Provider Note ? ? ? Event Date/Time  ? First MD Initiated Contact with Patient 12/31/21 2351   ?  (approximate) ? ? ?History  ? ?Flank Pain ? ? ?HPI ? ?Kelsey Maynard is a 46 y.o. female with history of asthma who presents to the emergency department with umbilical pain that started today.  Has history of umbilical hernia.  No overlying skin changes to her abdomen.  Denies fevers, vomiting, diarrhea, dysuria, hematuria, vaginal bleeding or discharge.  Has had previous cholecystectomy. ? ? ?History provided by patient. ? ? ? ?Past Medical History:  ?Diagnosis Date  ? Asthma   ? Trichimoniasis   ? ? ?Past Surgical History:  ?Procedure Laterality Date  ? CHOLECYSTECTOMY    ? HERNIA REPAIR    ? ? ?MEDICATIONS:  ?Prior to Admission medications   ?Medication Sig Start Date End Date Taking? Authorizing Provider  ?oxyCODONE-acetaminophen (PERCOCET) 5-325 MG tablet Take 1 tablet by mouth every 6 (six) hours as needed for severe pain. 01/01/22 01/01/23 Yes Erum Cercone, Delice Bison, DO  ?promethazine (PHENERGAN) 25 MG tablet Take 1 tablet (25 mg total) by mouth every 6 (six) hours as needed for nausea or vomiting. 01/01/22  Yes Terrel Manalo, Delice Bison, DO  ?albuterol (VENTOLIN HFA) 108 (90 Base) MCG/ACT inhaler Inhale 1-2 puffs into the lungs every 6 (six) hours as needed for wheezing or shortness of breath. 07/08/21   Tacy Learn, PA-C  ? ? ?Physical Exam  ? ?Triage Vital Signs: ?ED Triage Vitals  ?Enc Vitals Group  ?   BP 12/31/21 1802 (!) 155/102  ?   Pulse Rate 12/31/21 1802 (!) 105  ?   Resp 12/31/21 1802 20  ?   Temp 12/31/21 1802 98.6 ?F (37 ?C)  ?   Temp Source 12/31/21 1802 Oral  ?   SpO2 12/31/21 1802 100 %  ?   Weight 12/31/21 1803 208 lb (94.3 kg)  ?   Height 12/31/21 1803 '5\' 8"'$  (1.727 m)  ?   Head Circumference --   ?   Peak Flow --   ?   Pain Score 12/31/21 1803 10  ?   Pain Loc --   ?   Pain Edu? --   ?   Excl. in [place]? --   ? ? ?Most recent vital signs: ?Vitals:  ? 12/31/21 2114 12/31/21 2358   ?BP: 112/73 111/83  ?Pulse: 75 76  ?Resp: 16 20  ?Temp:    ?SpO2: 98% 98%  ? ? ?CONSTITUTIONAL: Alert and oriented and responds appropriately to questions. Well-appearing; well-nourished ?HEAD: Normocephalic, atraumatic ?EYES: Conjunctivae clear, pupils appear equal, sclera nonicteric ?ENT: normal nose; moist mucous membranes ?NECK: Supple, normal ROM ?CARD: RRR; S1 and S2 appreciated; no murmurs, no clicks, no rubs, no gallops ?RESP: Normal chest excursion without splinting or tachypnea; breath sounds clear and equal bilaterally; no wheezes, no rhonchi, no rales, no hypoxia or respiratory distress, speaking full sentences ?ABD/GI: Normal bowel sounds; non-distended; soft, tender to palpation over the umbilicus where there is a small reducible hernia but no overlying skin changes.  No guarding or rebound.  No tenderness at McBurney's point. ?BACK: The back appears normal ?EXT: Normal ROM in all joints; no deformity noted, no edema; no cyanosis ?SKIN: Normal color for age and race; warm; no rash on exposed skin ?NEURO: Moves all extremities equally, normal speech ?PSYCH: The patient's mood and manner are appropriate. ? ? ?ED Results / Procedures / Treatments  ? ?LABS: ?(all labs ordered are  listed, but only abnormal results are displayed) ?Labs Reviewed  ?CBC WITH DIFFERENTIAL/PLATELET - Abnormal; Notable for the following components:  ?    Result Value  ? MCV 102.3 (*)   ? All other components within normal limits  ?URINALYSIS, ROUTINE W REFLEX MICROSCOPIC - Abnormal; Notable for the following components:  ? Color, Urine YELLOW (*)   ? APPearance CLEAR (*)   ? Ketones, ur 5 (*)   ? All other components within normal limits  ?COMPREHENSIVE METABOLIC PANEL  ?PREGNANCY, URINE  ?POC URINE PREG, ED  ?POC URINE PREG, ED  ? ? ? ?EKG: ? ? ?RADIOLOGY: ?My personal review and interpretation of imaging: CT of the abdomen pelvis shows a fat-containing periumbilical hernia with stranding. ? ?I have personally reviewed all  radiology reports.   ?CT Abdomen Pelvis W Contrast ? ?Result Date: 12/31/2021 ?CLINICAL DATA:  Right lower quadrant pain EXAM: CT ABDOMEN AND PELVIS WITH CONTRAST TECHNIQUE: Multidetector CT imaging of the abdomen and pelvis was performed using the standard protocol following bolus administration of intravenous contrast. RADIATION DOSE REDUCTION: This exam was performed according to the departmental dose-optimization program which includes automated exposure control, adjustment of the mA and/or kV according to patient size and/or use of iterative reconstruction technique. CONTRAST:  167m OMNIPAQUE IOHEXOL 300 MG/ML  SOLN COMPARISON:  CT 08/18/2021 FINDINGS: Lower chest: No acute abnormality. Hepatobiliary: Subcentimeter hypodensity in the right hepatic lobe too small to further characterize. Status post cholecystectomy. No biliary dilatation Pancreas: Unremarkable. No pancreatic ductal dilatation or surrounding inflammatory changes. Spleen: Normal in size without focal abnormality. Adrenals/Urinary Tract: Adrenal glands are unremarkable. Kidneys are normal, without renal calculi, focal lesion, or hydronephrosis. Bladder is unremarkable. Stomach/Bowel: Stomach is unremarkable. Small duodenal diverticulum. Negative appendix. No acute wall thickening. Mild diverticular disease of the left colon Vascular/Lymphatic: No significant vascular findings are present. No enlarged abdominal or pelvic lymph nodes. Reproductive: No suspicious adnexal mass. Exophytic fibroid off the anterior uterine corpus. Other: Negative for pelvic effusion or free air. Back containing periumbilical hernia with mild stranding Musculoskeletal: No acute or significant osseous findings. IMPRESSION: 1. Negative for acute appendicitis. 2. Small fat containing periumbilical hernia with mild stranding, correlate for focal symptoms to the region 3. Uterine fibroid Electronically Signed   By: KDonavan FoilM.D.   On: 12/31/2021 19:33    ? ? ?PROCEDURES: ? ?Critical Care performed: No ? ? ? ? ?Procedures ? ? ? ?IMPRESSION / MDM / ASSESSMENT AND PLAN / ED COURSE  ?I reviewed the triage vital signs and the nursing notes. ? ? ? ?Patient here with pain around her umbilicus.  History of periumbilical hernia. ? ? ? ? ?DIFFERENTIAL DIAGNOSIS (includes but not limited to):   Strangulated or incarcerated hernia, appendicitis, colitis, diverticulitis, bowel obstruction, UTI, kidney stone, pyelonephritis ? ? ?PLAN: We will obtain CBC, CMP, urinalysis, urine pregnancy test, CT of the abdomen pelvis.  Will give Toradol, Dilaudid for symptomatic relief. ? ? ?MEDICATIONS GIVEN IN ED: ?Medications  ?HYDROmorphone (DILAUDID) injection 1 mg (1 mg Intravenous Given 01/01/22 0001)  ?ketorolac (TORADOL) 15 MG/ML injection 15 mg (15 mg Intravenous Given 12/31/21 1829)  ?iohexol (OMNIPAQUE) 300 MG/ML solution 100 mL (100 mLs Intravenous Contrast Given 12/31/21 1916)  ? ? ? ?ED COURSE: Patient's labs show no leukocytosis.  Normal hemoglobin.  Normal electrolytes, renal function, LFTs.  Urine does not appear infected.  Pregnancy test is negative.  CT scan reviewed by myself and radiologist and shows a small fat-containing periumbilical hernia with mild stranding.  She is tender over this area but the hernia is reducible.  There are no overlying skin changes.  I suspect that this is the cause of her pain.  I do not feel she needs emergent surgical intervention given hernia is reducible and only containing fat.  Will discharge with pain medication and general surgery outpatient follow-up.  Patient is comfortable with this plan and reports feeling better after Dilaudid. ? ? ?At this time, I do not feel there is any life-threatening condition present. I reviewed all nursing notes, vitals, pertinent previous records.  All lab and urine results, EKGs, imaging ordered have been independently reviewed and interpreted by myself.  I reviewed all available radiology reports from any  imaging ordered this visit.  Based on my assessment, I feel the patient is safe to be discharged home without further emergent workup and can continue workup as an outpatient as needed. Discussed all findings, treatment plan as well as

## 2022-01-01 NOTE — Discharge Instructions (Addendum)

## 2022-01-20 ENCOUNTER — Ambulatory Visit: Payer: BLUE CROSS/BLUE SHIELD | Admitting: Surgery

## 2022-03-23 ENCOUNTER — Ambulatory Visit
Admission: EM | Admit: 2022-03-23 | Discharge: 2022-03-23 | Disposition: A | Discharge status: Home / Self Care | Payer: BLUE CROSS/BLUE SHIELD | Attending: Emergency Medicine | Admitting: Emergency Medicine

## 2022-03-23 ENCOUNTER — Encounter: Payer: Self-pay | Admitting: Emergency Medicine

## 2022-03-23 DIAGNOSIS — H66002 Acute suppurative otitis media without spontaneous rupture of ear drum, left ear: Secondary | ICD-10-CM | POA: Diagnosis not present

## 2022-03-23 DIAGNOSIS — J039 Acute tonsillitis, unspecified: Secondary | ICD-10-CM | POA: Diagnosis not present

## 2022-03-23 MED ORDER — AMOXICILLIN-POT CLAVULANATE 875-125 MG PO TABS: 1.0000 | ORAL_TABLET | Freq: Two times a day (BID) | ORAL | 0 refills | Status: AC

## 2022-03-23 MED ORDER — PROMETHAZINE HCL 25 MG PO TABS: 25.0000 mg | ORAL_TABLET | Freq: Four times a day (QID) | ORAL | 0 refills | Status: DC | PRN

## 2022-03-23 NOTE — Discharge Instructions (Addendum)
Take the Augmentin twice daily for 10 days with food for treatment of your ear infection and tonsillitis.  Take an over-the-counter probiotic 1 hour after each dose of antibiotic to prevent diarrhea.  Use over-the-counter Tylenol and ibuprofen as needed for pain or fever.  Place a hot water bottle, or heating pad, underneath your pillowcase at night to help dilate up your ear and aid in pain relief as well as resolution of the infection.  Use the Phenergan every 6 hours as needed for nausea.  Follow a clear liquid diet for the next 6-12 hours.  Return for reevaluation for any new or worsening symptoms.

## 2022-03-23 NOTE — ED Provider Notes (Signed)
[facility]    CSN: [id number] Arrival date & time: 03/23/22  1928      History   Chief Complaint Chief Complaint  Patient presents with   Chills   Otalgia   Emesis   Diarrhea    HPI Kelsey Maynard is a 46 y.o. female.   HPI  46 year old female here for evaluation of multiple complaints.  Patient reports that for the last 2 days she has been experiencing pain in her left ear, sore throat, fever with a Tmax of 101, nausea, vomiting, diarrhea.  She denies any runny nose, nasal congestion, or cough.  She states she has been able to keep down clear liquids today.  Past Medical History:  Diagnosis Date   Asthma    Trichimoniasis     There are no problems to display for this patient.   Past Surgical History:  Procedure Laterality Date   CHOLECYSTECTOMY     HERNIA REPAIR      OB History     Gravida  6   Para  5   Term  5   Preterm      AB      Living  5      SAB      IAB      Ectopic      Multiple      Live Births  5            Home Medications    Prior to Admission medications   Medication Sig Start Date End Date Taking? Authorizing Provider  amoxicillin-clavulanate (AUGMENTIN) 875-125 MG tablet Take 1 tablet by mouth every 12 (twelve) hours for 10 days. 03/23/22 04/02/22 Yes Margarette Canada, NP  promethazine (PHENERGAN) 25 MG tablet Take 1 tablet (25 mg total) by mouth every 6 (six) hours as needed for nausea or vomiting. 03/23/22  Yes Margarette Canada, NP  albuterol (VENTOLIN HFA) 108 (90 Base) MCG/ACT inhaler Inhale 1-2 puffs into the lungs every 6 (six) hours as needed for wheezing or shortness of breath. 07/08/21   Tacy Learn, PA-C  oxyCODONE-acetaminophen (PERCOCET) 5-325 MG tablet Take 1 tablet by mouth every 6 (six) hours as needed for severe pain. 01/01/22 01/01/23  Ward, Delice Bison, DO    Family History No family history on file.  Social History Social History   Tobacco Use   Smoking status: Never   Smokeless tobacco:  Never  Vaping Use   Vaping Use: Never used  Substance Use Topics   Alcohol use: No   Drug use: No     Allergies   Metronidazole, Prednisone, Sulfamethoxazole-trimethoprim, Vicodin [hydrocodone-acetaminophen], Hydrocodone-acetaminophen, Morphine, and Ondansetron hcl   Review of Systems Review of Systems  Constitutional:  Positive for fever.  HENT:  Positive for ear pain and sore throat. Negative for congestion and rhinorrhea.   Respiratory:  Negative for cough.   Gastrointestinal:  Positive for diarrhea, nausea and vomiting. Negative for abdominal pain.  Hematological: Negative.   Psychiatric/Behavioral: Negative.       Physical Exam Triage Vital Signs ED Triage Vitals [03/23/22 1951]  Enc Vitals Group     BP 113/79     Pulse Rate 68     Resp 16     Temp 99.1 F (37.3 C)     Temp Source Oral     SpO2 100 %     Weight      Height      Head Circumference      Peak Flow  Pain Score 7     Pain Loc      Pain Edu?      Excl. in [place]?    No data found.  Updated Vital Signs BP 113/79 (BP Location: Left Arm)   Pulse 68   Temp 99.1 F (37.3 C) (Oral)   Resp 16   LMP  (LMP Unknown)   SpO2 100%   Visual Acuity Right Eye Distance:   Left Eye Distance:   Bilateral Distance:    Right Eye Near:   Left Eye Near:    Bilateral Near:     Physical Exam Vitals and nursing note reviewed.  Constitutional:      Appearance: Normal appearance. She is ill-appearing.  HENT:     Head: Normocephalic and atraumatic.     Right Ear: Tympanic membrane, ear canal and external ear normal. There is no impacted cerumen.     Left Ear: Ear canal and external ear normal. There is no impacted cerumen.     Nose: Nose normal. No congestion or rhinorrhea.     Mouth/Throat:     Mouth: Mucous membranes are moist.     Pharynx: Oropharynx is clear. Posterior oropharyngeal erythema present. No oropharyngeal exudate.  Cardiovascular:     Rate and Rhythm: Normal rate and regular rhythm.      Pulses: Normal pulses.     Heart sounds: Normal heart sounds. No murmur heard.    No friction rub. No gallop.  Pulmonary:     Effort: Pulmonary effort is normal.     Breath sounds: Normal breath sounds. No wheezing, rhonchi or rales.  Musculoskeletal:     Cervical back: Normal range of motion and neck supple.  Lymphadenopathy:     Cervical: No cervical adenopathy.  Skin:    General: Skin is warm and dry.     Capillary Refill: Capillary refill takes less than 2 seconds.     Findings: No erythema or rash.  Neurological:     General: No focal deficit present.     Mental Status: She is alert and oriented to person, place, and time.  Psychiatric:        Mood and Affect: Mood normal.        Behavior: Behavior normal.        Thought Content: Thought content normal.        Judgment: Judgment normal.      UC Treatments / Results  Labs (all labs ordered are listed, but only abnormal results are displayed) Labs Reviewed - No data to display  EKG   Radiology No results found.  Procedures Procedures (including critical care time)  Medications Ordered in UC Medications - No data to display  Initial Impression / Assessment and Plan / UC Course  I have reviewed the triage vital signs and the nursing notes.  Pertinent labs & imaging results that were available during my care of the patient were reviewed by me and considered in my medical decision making (see chart for details).  Patient is a pleasant, though ill-appearing, 46 year old female here for evaluation of left ear pain, sore throat, chills, and GI complaints that began 2 days ago.  She states she has been able to keep down Gatorade and clear fluids today.  Her physical exam reveals an erythematous and injected left tympanic membrane.  Right TM is pearly gray in appearance with normal light reflex.  Both external auditory canals are clear.  Nasal mucosa is unremarkable.  Oropharyngeal exam reveals 2+ edematous and erythematous  tonsillar pillars without exudate.  No cervical lymphadenopathy appreciable exam.  Cardiopulmonary exam reveals S1-S2 heart sounds with regular rate and rhythm and lung sounds are clear to auscultation all fields.  Abdomen is soft and nontender.  Patient Damas consistent with otitis media and tonsillitis.  I believe this is what is leading to her GI issues.  I will treat her with Augmentin twice a day for 10 days for treatment of otitis media and tonsillitis.  I will also prescribe Phenergan for her to use every 6 hours as needed for nausea and vomiting as she gets facial swelling from Zofran.  I have asked her to follow a clear liquid diet for the next 6 to 10 hours and then slowly advance that as tolerated.  Work note provided.   Final Clinical Impressions(s) / UC Diagnoses   Final diagnoses:  Non-recurrent acute suppurative otitis media of left ear without spontaneous rupture of tympanic membrane  Acute tonsillitis, unspecified etiology     Discharge Instructions      Take the Augmentin twice daily for 10 days with food for treatment of your ear infection and tonsillitis.  Take an over-the-counter probiotic 1 hour after each dose of antibiotic to prevent diarrhea.  Use over-the-counter Tylenol and ibuprofen as needed for pain or fever.  Place a hot water bottle, or heating pad, underneath your pillowcase at night to help dilate up your ear and aid in pain relief as well as resolution of the infection.  Use the Phenergan every 6 hours as needed for nausea.  Follow a clear liquid diet for the next 6-12 hours.  Return for reevaluation for any new or worsening symptoms.      ED Prescriptions     Medication Sig Dispense Auth. Provider   amoxicillin-clavulanate (AUGMENTIN) 875-125 MG tablet Take 1 tablet by mouth every 12 (twelve) hours for 10 days. 20 tablet Margarette Canada, NP   promethazine (PHENERGAN) 25 MG tablet Take 1 tablet (25 mg total) by mouth every 6 (six) hours as needed for  nausea or vomiting. 30 tablet Margarette Canada, NP      PDMP not reviewed this encounter.   Margarette Canada, NP 03/23/22 2028

## 2022-03-23 NOTE — ED Triage Notes (Signed)
Pt presents with chills, vomiting, diarrhea and left ear pain x 2 days.

## 2022-05-11 ENCOUNTER — Other Ambulatory Visit: Payer: Self-pay

## 2022-05-11 ENCOUNTER — Ambulatory Visit
Admission: EM | Admit: 2022-05-11 | Discharge: 2022-05-11 | Disposition: A | Discharge status: Home / Self Care | Payer: BLUE CROSS/BLUE SHIELD | Attending: Family Medicine | Admitting: Family Medicine

## 2022-05-11 ENCOUNTER — Encounter: Payer: Self-pay | Admitting: Emergency Medicine

## 2022-05-11 DIAGNOSIS — Z20822 Contact with and (suspected) exposure to covid-19: Secondary | ICD-10-CM | POA: Diagnosis present

## 2022-05-11 DIAGNOSIS — J069 Acute upper respiratory infection, unspecified: Secondary | ICD-10-CM

## 2022-05-11 LAB — SARS CORONAVIRUS 2 BY RT PCR: SARS Coronavirus 2 by RT PCR: NEGATIVE

## 2022-05-11 NOTE — ED Triage Notes (Signed)
Pt c/o fever (101), chills, headache. Started last night. She states she has been exposed to covid at work.

## 2022-05-11 NOTE — ED Provider Notes (Signed)
[facility]    CSN: [id number] Arrival date & time: 05/11/22  1008      History   Chief Complaint Chief Complaint  Patient presents with   Fever   Covid Exposure    HPI Kelsey Maynard is a 46 y.o. female.   HPI   Kelsey Maynard presents after COVID exposure. She had a fever 101 F and started having a migraine headache. Reports similar sx when she had COVID the first time in Jan 2023. Has decreased appetite. Has non-bloody diarrhea. Symptoms started yesterday.  She was exposed to Kelsey Maynard at work on Thursday.  A co-worker tested positive after a morning meeting and several others who tested positive were also in the same meeting she was in.  Denies chest pain and shortness of breath. Has some neck pain associated with her headache.    Fever : yes Chills: yes  Sore throat: yes Cough: no Nasal congestion : no  Rhinorrhea: no Myalgias: yes Changes to taste or smell: no  Appetite: decreased  Hydration: normal  Abdominal pain: no Nausea: no Vomiting: no Diarrhea: yes Rash: no  Sleep disturbance: no  Headache: yes      Past Medical History:  Diagnosis Date   Asthma    Trichimoniasis     There are no problems to display for this patient.   Past Surgical History:  Procedure Laterality Date   CHOLECYSTECTOMY     HERNIA REPAIR      OB History     Gravida  6   Para  5   Term  5   Preterm      AB      Living  5      SAB      IAB      Ectopic      Multiple      Live Births  5            Home Medications    Prior to Admission medications   Medication Sig Start Date End Date Taking? Authorizing Provider  albuterol (VENTOLIN HFA) 108 (90 Base) MCG/ACT inhaler Inhale 1-2 puffs into the lungs every 6 (six) hours as needed for wheezing or shortness of breath. 07/08/21   Tacy Learn, PA-C  oxyCODONE-acetaminophen (PERCOCET) 5-325 MG tablet Take 1 tablet by mouth every 6 (six) hours as needed for severe pain. 01/01/22 01/01/23  Ward, Delice Bison, DO  promethazine (PHENERGAN) 25 MG tablet Take 1 tablet (25 mg total) by mouth every 6 (six) hours as needed for nausea or vomiting. 03/23/22   Margarette Canada, NP    Family History No family history on file.  Social History Social History   Tobacco Use   Smoking status: Never   Smokeless tobacco: Never  Vaping Use   Vaping Use: Never used  Substance Use Topics   Alcohol use: No   Drug use: No     Allergies   Metronidazole, Prednisone, Sulfamethoxazole-trimethoprim, Vicodin [hydrocodone-acetaminophen], Hydrocodone-acetaminophen, Morphine, and Ondansetron hcl   Review of Systems Review of Systems: negative unless otherwise stated in HPI.      Physical Exam Triage Vital Signs ED Triage Vitals  Enc Vitals Group     BP 05/11/22 1042 108/83     Pulse Rate 05/11/22 1042 97     Resp 05/11/22 1042 16     Temp 05/11/22 1042 98.7 F (37.1 C)     Temp Source 05/11/22 1042 Oral     SpO2 05/11/22 1042 100 %  Weight 05/11/22 1039 207 lb 14.3 oz (94.3 kg)     Height 05/11/22 1039 '5\' 8"'$  (1.727 m)     Head Circumference --      Peak Flow --      Pain Score 05/11/22 1039 9     Pain Loc --      Pain Edu? --      Excl. in [place]? --    No data found.  Updated Vital Signs BP 108/83 (BP Location: Left Arm)   Pulse 97   Temp 98.7 F (37.1 C) (Oral)   Resp 16   Ht '5\' 8"'$  (1.727 m)   Wt 94.3 kg   LMP 04/29/2022 (Approximate)   SpO2 100%   Breastfeeding No   BMI 31.61 kg/m   Visual Acuity Right Eye Distance:   Left Eye Distance:   Bilateral Distance:    Right Eye Near:   Left Eye Near:    Bilateral Near:     Physical Exam GEN:     alert, non-toxic appearing female in no distress    HENT:  mucus membranes moist, oropharyngeal without lesions or exudate, no tonsillar hypertrophy,  mild oropharyngeal erythema ,  moderate erythematous hypertrophied turbinates, clear nasal discharge, bilateral TM normal EYES:   pupils equal and reactive, EOMi, no scleral injection NECK:   normal ROM, no meningismus   RESP:  no increased work of breathing, clear to auscultation bilaterally CVS:   regular rate and rhythm Skin:   warm and dry    UC Treatments / Results  Labs (all labs ordered are listed, but only abnormal results are displayed) Labs Reviewed  SARS CORONAVIRUS 2 BY RT PCR    EKG   Radiology No results found.  Procedures Procedures (including critical care time)  Medications Ordered in UC Medications - No data to display  Initial Impression / Assessment and Plan / UC Course  I have reviewed the triage vital signs and the nursing notes.  Pertinent labs & imaging results that were available during my care of the patient were reviewed by me and considered in my medical decision making (see chart for details).       Pt is a 47 y.o. female who presents for 1 days of respiratory symptoms after COVID exposure. Kelsey Maynard is afebrile here without recent antipyretics. Satting well on room air. Overall pt is well appearing, well hydrated, without respiratory distress. Pulmonary exam is unremarkable. COVID testing obtained and was negative. History consistent with viral respiratory illness. Discussed symptomatic treatment.  Explained lack of efficacy of antibiotics in viral disease. - continue Tylenol/ Motrin as needed for discomfort/fever - nasal saline to help with his nasal congestion - Use a mist humidifier to help with breathing - Stressed importance of hydration - Work note provided, per pt request    - Discussed return and ED precautions, understanding voiced.   Discussed MDM, treatment plan and plan for follow-up with patient/parent who agrees with plan.     Final Clinical Impressions(s) / UC Diagnoses   Final diagnoses:  Close exposure to COVID-19 virus  Viral upper respiratory tract infection     Discharge Instructions      We will contact you if your COVID test is positive.  Please quarantine while you wait for the results.  If your  test is negative you may resume normal activities.  If your test is positive please continue to quarantine for at least 5 days from your symptom onset or until you are without a fever for  at least 24 hours after the medications.   You can take Tylenol and/or Ibuprofen as needed for fever reduction and pain relief.    For cough: honey 1/2 to 1 teaspoon (you can dilute the honey in water or another fluid).  You can also use guaifenesin and dextromethorphan for cough. You can use a humidifier for chest congestion and cough.  If you don't have a humidifier, you can sit in the bathroom with the hot shower running.      For sore throat: try warm salt water gargles, cepacol lozenges, throat spray, warm tea or water with lemon/honey, popsicles or ice, or OTC cold relief medicine for throat discomfort.    For congestion: take a daily anti-histamine like Zyrtec, Claritin, and a oral decongestant, such as pseudoephedrine.  You can also use Flonase 1-2 sprays in each nostril daily.    It is important to stay hydrated: drink plenty of fluids (water, gatorade/powerade/pedialyte, juices, or teas) to keep your throat moisturized and help further relieve irritation/discomfort.    Return or go to the Emergency Department if symptoms worsen or do not improve in the next few days      ED Prescriptions   None    PDMP not reviewed this encounter.   Lyndee Hensen, DO 05/11/22 1121

## 2022-05-11 NOTE — Discharge Instructions (Addendum)

## 2022-05-25 ENCOUNTER — Other Ambulatory Visit: Payer: Self-pay

## 2022-05-25 ENCOUNTER — Encounter: Payer: Self-pay | Admitting: Emergency Medicine

## 2022-05-25 ENCOUNTER — Emergency Department
Admission: EM | Admit: 2022-05-25 | Discharge: 2022-05-25 | Disposition: A | Discharge status: Home / Self Care | Payer: BLUE CROSS/BLUE SHIELD | Attending: Emergency Medicine | Admitting: Emergency Medicine

## 2022-05-25 ENCOUNTER — Emergency Department: Payer: BLUE CROSS/BLUE SHIELD

## 2022-05-25 DIAGNOSIS — J45909 Unspecified asthma, uncomplicated: Secondary | ICD-10-CM | POA: Diagnosis not present

## 2022-05-25 DIAGNOSIS — R1033 Periumbilical pain: Secondary | ICD-10-CM

## 2022-05-25 DIAGNOSIS — K429 Umbilical hernia without obstruction or gangrene: Secondary | ICD-10-CM | POA: Insufficient documentation

## 2022-05-25 LAB — URINALYSIS, ROUTINE W REFLEX MICROSCOPIC
Bilirubin Urine: NEGATIVE
Glucose, UA: NEGATIVE mg/dL
Ketones, ur: NEGATIVE mg/dL
Leukocytes,Ua: NEGATIVE
Nitrite: NEGATIVE
Protein, ur: NEGATIVE mg/dL
Specific Gravity, Urine: 1.025 (ref 1.005–1.030)
pH: 5 (ref 5.0–8.0)

## 2022-05-25 LAB — COMPREHENSIVE METABOLIC PANEL
ALT: 12 U/L (ref 0–44)
AST: 28 U/L (ref 15–41)
Albumin: 4.1 g/dL (ref 3.5–5.0)
Alkaline Phosphatase: 62 U/L (ref 38–126)
Anion gap: 6 (ref 5–15)
BUN: 15 mg/dL (ref 6–20)
CO2: 24 mmol/L (ref 22–32)
Calcium: 9.2 mg/dL (ref 8.9–10.3)
Chloride: 109 mmol/L (ref 98–111)
Creatinine, Ser: 0.95 mg/dL (ref 0.44–1.00)
GFR, Estimated: >60 mL/min (ref >60)
Glucose, Bld: 85 mg/dL (ref 70–99)
Potassium: 4.6 mmol/L (ref 3.5–5.1)
Sodium: 139 mmol/L (ref 135–145)
Total Bilirubin: 1.4 mg/dL — ABNORMAL HIGH (ref 0.3–1.2)
Total Protein: 7.7 g/dL (ref 6.5–8.1)

## 2022-05-25 LAB — CBC WITH DIFFERENTIAL/PLATELET
Abs Immature Granulocytes: 0.02 10*3/uL (ref 0.00–0.07)
Basophils Absolute: 0.1 10*3/uL (ref 0.0–0.1)
Basophils Relative: 1 %
Eosinophils Absolute: 0.1 10*3/uL (ref 0.0–0.5)
Eosinophils Relative: 2 %
HCT: 39.8 % (ref 36.0–46.0)
Hemoglobin: 13 g/dL (ref 12.0–15.0)
Immature Granulocytes: 0 %
Lymphocytes Relative: 38 %
Lymphs Abs: 2.3 10*3/uL (ref 0.7–4.0)
MCH: 33.1 pg (ref 26.0–34.0)
MCHC: 32.7 g/dL (ref 30.0–36.0)
MCV: 101.3 fL — ABNORMAL HIGH (ref 80.0–100.0)
Monocytes Absolute: 0.5 10*3/uL (ref 0.1–1.0)
Monocytes Relative: 8 %
Neutro Abs: 3.1 10*3/uL (ref 1.7–7.7)
Neutrophils Relative %: 51 %
Platelets: 310 10*3/uL (ref 150–400)
RBC: 3.93 MIL/uL (ref 3.87–5.11)
RDW: 12.3 % (ref 11.5–15.5)
WBC: 6.1 10*3/uL (ref 4.0–10.5)
nRBC: 0 % (ref 0.0–0.2)

## 2022-05-25 LAB — PREGNANCY, URINE: Preg Test, Ur: NEGATIVE

## 2022-05-25 LAB — LIPASE, BLOOD: Lipase: 38 U/L (ref 11–51)

## 2022-05-25 MED ORDER — IOHEXOL 350 MG/ML SOLN
100.0000 mL | Freq: Once | INTRAVENOUS | Status: AC | PRN
  Administered 2022-05-25: 100 mL via INTRAVENOUS

## 2022-05-25 MED ORDER — PROCHLORPERAZINE EDISYLATE 10 MG/2ML IJ SOLN
10.0000 mg | Freq: Once | INTRAMUSCULAR | Status: AC
  Administered 2022-05-25: 10 mg via INTRAVENOUS
  Filled 2022-05-25: qty 2

## 2022-05-25 NOTE — ED Provider Notes (Signed)
[facility] Provider Note  Patient Contact: 4:16 PM (approximate)   History   Abdominal Pain   HPI  Kelsey Maynard is a 46 y.o. female with a history of asthma, presents to the emergency department with right mid abdominal and periumbilical abdominal pain.  Patient states that it feels like her umbilical hernia type pain.  Patient states that she has had discomfort for over a year but her pain worsened today.  She has had no associated vomiting, diarrhea or fever.  She denies heavy lifting, coughing or recent falls.      Physical Exam   Triage Vital Signs: ED Triage Vitals  Enc Vitals Group     BP 05/25/22 1417 107/67     Pulse Rate 05/25/22 1417 75     Resp 05/25/22 1417 20     Temp 05/25/22 1417 98.6 F (37 C)     Temp Source 05/25/22 1417 Oral     SpO2 05/25/22 1417 93 %     Weight 05/25/22 1417 200 lb (90.7 kg)     Height 05/25/22 1417 '5\' 8"'$  (1.727 m)     Head Circumference --      Peak Flow --      Pain Score 05/25/22 1423 10     Pain Loc --      Pain Edu? --      Excl. in [place]? --     Most recent vital signs: Vitals:   05/25/22 1417  BP: 107/67  Pulse: 75  Resp: 20  Temp: 98.6 F (37 C)  SpO2: 93%     General: Alert and in no acute distress. Eyes:  PERRL. EOMI. Head: No acute traumatic findings ENT:      Nose: No congestion/rhinnorhea.      Mouth/Throat: Mucous membranes are moist. Neck: No stridor. No cervical spine tenderness to palpation. Cardiovascular:  Good peripheral perfusion Respiratory: Normal respiratory effort without tachypnea or retractions. Lungs CTAB. Good air entry to the bases with no decreased or absent breath sounds. Gastrointestinal: Bowel sounds 4 quadrants. Soft and nontender to palpation. No guarding or rigidity. No palpable masses. No distention. No CVA tenderness. Musculoskeletal: Full range of motion to all extremities.  Neurologic:  No gross focal neurologic deficits are appreciated.  Skin: No  erythema of the skin overlying the abdomen. Other:   ED Results / Procedures / Treatments   Labs (all labs ordered are listed, but only abnormal results are displayed) Labs Reviewed  COMPREHENSIVE METABOLIC PANEL - Abnormal; Notable for the following components:      Result Value   Total Bilirubin 1.4 (*)    All other components within normal limits  CBC WITH DIFFERENTIAL/PLATELET - Abnormal; Notable for the following components:   MCV 101.3 (*)    All other components within normal limits  URINALYSIS, ROUTINE W REFLEX MICROSCOPIC - Abnormal; Notable for the following components:   Color, Urine YELLOW (*)    APPearance HAZY (*)    Hgb urine dipstick SMALL (*)    Bacteria, UA RARE (*)    All other components within normal limits  LIPASE, BLOOD  PREGNANCY, URINE  POC URINE PREG, ED      RADIOLOGY  I personally viewed and evaluated these images as part of my medical decision making, as well as reviewing the written report by the radiologist.  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                              Assessment and plan: Abdominal pain:  46 year old female presents to the emergency department with periumbilical and right mid abdominal pain that is occurred for the past year but worsened today.  Vital signs are reassuring at triage.  On exam, patient was alert, active and nontoxic-appearing.  She had no reproducible tenderness.  I did not palpate a hernia and patient had no guarding.  There is no erythema  overlying the skin.  CBC, CMP, lipase and urinalysis reassuring.  Will await CT abdomen pelvis results and will reassess.   ----------------------------------------- 5:28 PM on 05/25/2022 ----------------------------------------- CT abdomen pelvis results concerning for bilateral paraumbilical fat-containing hernias with inflammatory stranding.  I reached out to general surgeon on-call, Dr. Lysle Pearl.  Very much appreciate time and consult.  Dr. Lysle Pearl will review CT abdomen pelvis results and provide update.  Dr. Lysle Pearl stated that he will come into the emergency department to attempt reduction of hernias.  I checked on patient before Dr. Lysle Pearl came in and she reported that her pain had completely resolved and she felt comfortable following up with him as an outpatient.  Dr. Lysle Pearl mentioned that he had appointments available on May 26, 2022.  I communicated that patient needed to call office to schedule an appointment.  She voiced understanding and a work note was provided.  FINAL CLINICAL IMPRESSION(S) / ED DIAGNOSES   Final diagnoses:  Periumbilical abdominal pain     Rx / DC Orders   ED Discharge Orders     None        Note:  This document was prepared using Dragon voice recognition software and may include unintentional dictation errors.   Vallarie Mare [place], PA-C 05/25/22 1742    Delman Kitten, MD 05/25/22 2342

## 2022-05-25 NOTE — ED Provider Notes (Signed)
  Emergency Medicine Provider Triage Evaluation Note  Kelsey Maynard , a 46 y.o.female,  was evaluated in triage.  Pt complains of sudden onset abdominal pain that started approximately 1 hour ago while she was trying to eat lunch.  Endorsing severe pain and nausea at this time.  She has had a her gallbladder removed in the past.   Review of Systems  Positive: Abdominal pain, nausea Negative: Denies fever, chest pain, vomiting  Physical Exam  There were no vitals filed for this visit. Gen:   Awake, appears very uncomfortable Resp:  Normal effort  MSK:   Moves extremities without difficulty  Other:  Tenderness appreciated on the right side the abdomen, both upper and lower quadrants.  Medical Decision Making  Given the patient's initial medical screening exam, the following diagnostic evaluation has been ordered. The patient will be placed in the appropriate treatment space, once one is available, to complete the evaluation and treatment. I have discussed the plan of care with the patient and I have advised the patient that an ED physician or mid-level practitioner will reevaluate their condition after the test results have been received, as the results may give them additional insight into the type of treatment they may need.    Diagnostics: Labs, UA, abdominal CT  Treatments: Compazine   Teodoro Spray, PA 05/25/22 1415    Merlyn Lot, MD 05/25/22 [phone]

## 2022-05-25 NOTE — Discharge Instructions (Signed)
Please call Dr. Ines Bloomer office tomorrow to schedule an appointment as he stated that he has some appointments available.

## 2022-05-25 NOTE — ED Triage Notes (Signed)
Pt complains of sharp stabbing right sided abd pain started 15 mins ago with nausea denies vomiting. Had a similar pain a month or so ago and was told she had a hernia. Pt says that she was supposed to follow up with a specialist but never received a call so has not follow up with anyone.

## 2022-05-25 NOTE — ED Notes (Signed)
Pt A&O, IV removed, pt given discharge instructions, pt ambulating with steady gait. 

## 2022-06-14 ENCOUNTER — Emergency Department
Admission: EM | Admit: 2022-06-14 | Discharge: 2022-06-14 | Disposition: A | Discharge status: Home / Self Care | Payer: BLUE CROSS/BLUE SHIELD | Attending: Emergency Medicine | Admitting: Emergency Medicine

## 2022-06-14 ENCOUNTER — Other Ambulatory Visit: Payer: Self-pay

## 2022-06-14 ENCOUNTER — Encounter: Payer: Self-pay | Admitting: Emergency Medicine

## 2022-06-14 DIAGNOSIS — K429 Umbilical hernia without obstruction or gangrene: Secondary | ICD-10-CM

## 2022-06-14 LAB — URINALYSIS, ROUTINE W REFLEX MICROSCOPIC
Bilirubin Urine: NEGATIVE
Glucose, UA: NEGATIVE mg/dL
Hgb urine dipstick: NEGATIVE
Ketones, ur: NEGATIVE mg/dL
Nitrite: NEGATIVE
Protein, ur: 30 mg/dL — AB
Specific Gravity, Urine: 1.029 (ref 1.005–1.030)
pH: 5 (ref 5.0–8.0)

## 2022-06-14 LAB — CBC WITH DIFFERENTIAL/PLATELET
Abs Immature Granulocytes: 0.04 10*3/uL (ref 0.00–0.07)
Basophils Absolute: 0 10*3/uL (ref 0.0–0.1)
Basophils Relative: 0 %
Eosinophils Absolute: 0 10*3/uL (ref 0.0–0.5)
Eosinophils Relative: 0 %
HCT: 37.8 % (ref 36.0–46.0)
Hemoglobin: 12.3 g/dL (ref 12.0–15.0)
Immature Granulocytes: 0 %
Lymphocytes Relative: 10 %
Lymphs Abs: 1.2 10*3/uL (ref 0.7–4.0)
MCH: 33.5 pg (ref 26.0–34.0)
MCHC: 32.5 g/dL (ref 30.0–36.0)
MCV: 103 fL — ABNORMAL HIGH (ref 80.0–100.0)
Monocytes Absolute: 0.2 10*3/uL (ref 0.1–1.0)
Monocytes Relative: 2 %
Neutro Abs: 10.8 10*3/uL — ABNORMAL HIGH (ref 1.7–7.7)
Neutrophils Relative %: 88 %
Platelets: 260 10*3/uL (ref 150–400)
RBC: 3.67 MIL/uL — ABNORMAL LOW (ref 3.87–5.11)
RDW: 12.7 % (ref 11.5–15.5)
WBC: 12.3 10*3/uL — ABNORMAL HIGH (ref 4.0–10.5)
nRBC: 0 % (ref 0.0–0.2)

## 2022-06-14 LAB — BASIC METABOLIC PANEL
Anion gap: 8 (ref 5–15)
BUN: 15 mg/dL (ref 6–20)
CO2: 23 mmol/L (ref 22–32)
Calcium: 9 mg/dL (ref 8.9–10.3)
Chloride: 106 mmol/L (ref 98–111)
Creatinine, Ser: 0.8 mg/dL (ref 0.44–1.00)
GFR, Estimated: >60 mL/min (ref >60)
Glucose, Bld: 92 mg/dL (ref 70–99)
Potassium: 3.9 mmol/L (ref 3.5–5.1)
Sodium: 137 mmol/L (ref 135–145)

## 2022-06-14 LAB — POC URINE PREG, ED: Preg Test, Ur: NEGATIVE

## 2022-06-14 MED ORDER — OXYCODONE HCL 5 MG PO TABS
5.0000 mg | ORAL_TABLET | Freq: Once | ORAL | Status: AC
  Administered 2022-06-14: 5 mg via ORAL
  Filled 2022-06-14: qty 1

## 2022-06-14 MED ORDER — KETOROLAC TROMETHAMINE 30 MG/ML IJ SOLN
30.0000 mg | Freq: Once | INTRAMUSCULAR | Status: AC
Start: 2022-06-14 — End: 2022-06-14
  Administered 2022-06-14: 30 mg via INTRAMUSCULAR
  Filled 2022-06-14: qty 1

## 2022-06-14 MED ORDER — OXYCODONE-ACETAMINOPHEN 5-325 MG PO TABS: 1.0000 | ORAL_TABLET | Freq: Three times a day (TID) | ORAL | 0 refills | Status: AC | PRN

## 2022-06-14 NOTE — ED Triage Notes (Signed)
Pt to ED via POV c/o right sided abdominal pain that started today. Pt denies any urinary symptoms. Pt states that she has hx/o kidney stones but she has never passed it. Pt states that she took a percocet at home before coming.

## 2022-06-14 NOTE — ED Provider Notes (Signed)
[facility] Emergency Department Provider Note     Event Date/Time   First MD Initiated Contact with Patient 06/14/22 2053     (approximate)   History   Flank Pain   HPI  Kelsey Maynard is a 46 y.o. female presents to the ED for evaluation of acute periumbilical and right side abdominal pain.  She gives a history of periumbilical hernias and notes that symptoms feel similar.  She reports onset of symptoms was prior to arrival.  She denies any associated nausea, vomiting, urinary symptoms, or bowel changes.  She also denies any gross hematuria.  She took Percocet from her last visit, prior to arrival but denies any significant benefit.  Patient reports a history of kidney stones, but chart review of her multiple previous CT scans does not indicate any history.  She does have uterine fibroids, and redemonstrated inflammatory changes about a periumbilical hernia.   Physical Exam   Triage Vital Signs: ED Triage Vitals  Enc Vitals Group     BP 06/14/22 1824 (!) 123/104     Pulse Rate 06/14/22 1824 77     Resp 06/14/22 1824 16     Temp 06/14/22 1824 98.5 F (36.9 C)     Temp Source 06/14/22 1824 Oral     SpO2 06/14/22 1824 100 %     Weight --      Height --      Head Circumference --      Peak Flow --      Pain Score 06/14/22 1823 10     Pain Loc --      Pain Edu? --      Excl. in [place]? --     Most recent vital signs: Vitals:   06/14/22 2150 06/14/22 2202  BP: (!) 113/58 (!) 113/56  Pulse: 73 77  Resp: 17 20  Temp: 98.1 F (36.7 C) 98.2 F (36.8 C)  SpO2: 97% 97%    General Awake, no distress. NAD CV:  Good peripheral perfusion.  RESP:  Normal effort.  ABD:  No distention. Soft, mildly tender to palp over the superior umbilicus. Normal bowel sounds noted. No, guarding, or rigidity appreciated.   ED Results / Procedures / Treatments   Labs (all labs ordered are listed, but only abnormal results are displayed) Labs Reviewed  URINALYSIS,  ROUTINE W REFLEX MICROSCOPIC - Abnormal; Notable for the following components:      Result Value   Color, Urine YELLOW (*)    APPearance CLOUDY (*)    Protein, ur 30 (*)    Leukocytes,Ua LARGE (*)    Bacteria, UA RARE (*)    All other components within normal limits  CBC WITH DIFFERENTIAL/PLATELET - Abnormal; Notable for the following components:   WBC 12.3 (*)    RBC 3.67 (*)    MCV 103.0 (*)    Neutro Abs 10.8 (*)    All other components within normal limits  URINE CULTURE  BASIC METABOLIC PANEL  POC URINE PREG, ED     EKG   RADIOLOGY  No results found.   PROCEDURES:  Critical Care performed: No  Procedures   MEDICATIONS ORDERED IN ED: Medications  ketorolac (TORADOL) 30 MG/ML injection 30 mg (30 mg Intramuscular Given 06/14/22 2156)  oxyCODONE (Oxy IR/ROXICODONE) immediate release tablet 5 mg (5 mg Oral Given 06/14/22 2156)     IMPRESSION / MDM / ASSESSMENT AND PLAN / ED COURSE  I reviewed the triage vital signs and the nursing  notes.                              Differential diagnosis includes, but is not limited to, ovarian cyst, ovarian torsion, acute appendicitis, diverticulitis, urinary tract infection/pyelonephritis, endometriosis, bowel obstruction, colitis, renal colic, gastroenteritis, hernia, fibroids, endometriosis, pregnancy related pain including ectopic pregnancy, etc.   Patient's presentation is most consistent with acute complicated illness / injury requiring diagnostic workup.  Patient to the ED for evaluation of periumbilical pain consistent with her previously diagnosed periumbilical fat-containing hernia.  Patient without any signs on exam of an acute abdominal process.  She is afebrile with reassuring labs and vital signs.  Chart review reveals multiple visits over the last year with multiple CT scans revealing no kidney stones, colitis, diverticulitis, or appendicitis.  No other acute findings with the exception of the patient's chronic  persistent umbilical hernia.  Patient been referred to general surgery multiple times for her visits.  She has failed to make contact with general surgery, and after further discussion, noted some hesitancy secondary to fear and anxiety.  Patient with the patient that her exam again today is reassuring labs with a mild bump in her white cell count which may be related to some mild inflammation related to her umbilical hernia. Her symptoms are consistent with her periumbilical hernia.  We discussed that there is no need at this point to reevaluate the patient with yet another CT scan, as she just had 1 less than 4 weeks ago.  Patient is agreeable with the clinical diagnosis.  Patient's diagnosis is consistent with periumbilical abdominal pain secondary to underlying fat-containing hernia. Patient will be discharged home with a small prescription for Percocet. Patient is to follow up with general surgery again, for surgical consultation, as needed or otherwise directed. Patient is given ED precautions to return to the ED for any worsening or new symptoms.     FINAL CLINICAL IMPRESSION(S) / ED DIAGNOSES   Final diagnoses:  Periumbilical hernia     Rx / DC Orders   ED Discharge Orders          Ordered    oxyCODONE-acetaminophen (PERCOCET) 5-325 MG tablet  Every 8 hours PRN        06/14/22 2300             Note:  This document was prepared using Dragon voice recognition software and may include unintentional dictation errors.    Melvenia Needles, PA-C 06/14/22 2347    Blake Divine, MD 06/16/22 [phone]

## 2022-06-14 NOTE — ED Provider Triage Note (Signed)
Emergency Medicine Provider Triage Evaluation Note  Kelsey Maynard, a 46 y.o. female was evaluated in triage.  Pt complains of right flank pain.  Patient with a history of kidney stones, presents with onset of symptoms few hours prior to arrival.  She denies any nausea, vomiting, urinary retention patient also denies any gross hematuria.  She took a Percocet at symptom onset.  Review of Systems  Positive: Flank pain  Negative: NVD  Physical Exam  LMP 05/22/2022 (Exact Date)  Gen:   Awake, no distress   Resp:  Normal effort  MSK:   Moves extremities without difficulty  Other:    Medical Decision Making  Medically screening exam initiated at 6:22 PM.  Appropriate orders placed.  Kelsey Maynard was informed that the remainder of the evaluation will be completed by another provider, this initial triage assessment does not replace that evaluation, and the importance of remaining in the ED until their evaluation is complete.  Patient to the ED for evaluation of right flank pain, with a history of kidney stones.   Melvenia Needles, PA-C 06/14/22 1825

## 2022-06-14 NOTE — ED Notes (Signed)
RN first encounter with pt prior to discharge. Pt verbalized understanding of discharge instructions, prescriptions, and follow-up care instructions.

## 2022-06-14 NOTE — Discharge Instructions (Signed)
Your exam and labs are reassuring at this time.  Your symptoms are consistent with umbilical hernia.  Take the previously prescribed nausea medicine along with the pain medicine as needed.  Follow-up with general surgery as discussed, for surgical consultation.  You will continue to experience intermittent symptoms until this umbilical hernia is repaired.

## 2022-06-16 LAB — URINE CULTURE

## 2022-06-21 ENCOUNTER — Ambulatory Visit: Payer: Self-pay | Admitting: Surgery

## 2022-07-06 ENCOUNTER — Ambulatory Visit: Payer: Self-pay | Admitting: Surgery

## 2022-07-06 NOTE — Progress Notes (Deleted)
Patient ID: Kelsey Maynard, female   DOB: October 03, 1975, 46 y.o.   MRN: [id number]  Chief Complaint:  ***  History of Present Illness Kelsey Maynard is a 46 y.o. female with ***.  Past Medical History Past Medical History:  Diagnosis Date   Asthma    Trichimoniasis       Past Surgical History:  Procedure Laterality Date   CHOLECYSTECTOMY     HERNIA REPAIR      Allergies  Allergen Reactions   Metronidazole Hives, Itching and Swelling   Prednisone Anaphylaxis, Hives and Nausea And Vomiting   Sulfamethoxazole-Trimethoprim Swelling, Other (See Comments) and Hives    Reaction:  Facial swelling   Vicodin [Hydrocodone-Acetaminophen] Hives and Itching   Hydrocodone-Acetaminophen Rash   Morphine Rash   Ondansetron Hcl Swelling, Other (See Comments) and Rash    Reaction:  Facial swelling    Current Outpatient Medications  Medication Sig Dispense Refill   albuterol (VENTOLIN HFA) 108 (90 Base) MCG/ACT inhaler Inhale 1-2 puffs into the lungs every 6 (six) hours as needed for wheezing or shortness of breath. 18 g 6   No current facility-administered medications for this visit.    Family History No family history on file.    Social History Social History   Tobacco Use   Smoking status: Never   Smokeless tobacco: Never  Vaping Use   Vaping Use: Never used  Substance Use Topics   Alcohol use: No   Drug use: No        ROS   Physical Exam There were no vitals taken for this visit.   CONSTITUTIONAL: Well developed, and nourished, appropriately responsive and aware without distress. ***  EYES: Sclera non-icteric.   EARS, NOSE, MOUTH AND THROAT: Mask worn.  *** The oropharynx is clear. Oral mucosa is pink and moist.  Dentition: ***   Hearing is intact to voice.  NECK: Trachea is midline, and there is no jugular venous distension.  LYMPH NODES:  Lymph nodes in the neck are not appreciated. RESPIRATORY:  Lungs are clear, and breath sounds are equal bilaterally. Normal  respiratory effort without pathologic use of accessory muscles. CARDIOVASCULAR: Heart is regular in rate and rhythm.  Well perfused.  GI: The abdomen is *** soft, nontender, and nondistended. There were no palpable masses. I did not appreciate hepatosplenomegaly. There were normal bowel sounds. GU: *** MUSCULOSKELETAL:  Symmetrical muscle tone appreciated in all four extremities.    SKIN: Skin turgor is normal. No pathologic skin lesions appreciated.  NEUROLOGIC:  Motor and sensation appear grossly normal.  Cranial nerves are grossly without defect. PSYCH:  Alert and oriented to person, place and time. Affect is appropriate for situation.  Data Reviewed I have personally reviewed what is currently available of the patient's imaging, recent labs and medical records.   Labs:     Latest Ref Rng & Units 06/14/2022    9:57 PM 05/25/2022    2:27 PM 12/31/2021    6:08 PM  CBC  WBC 4.0 - 10.5 K/uL 12.3  6.1  8.0   Hemoglobin 12.0 - 15.0 g/dL 12.3  13.0  13.2   Hematocrit 36.0 - 46.0 % 37.8  39.8  40.7   Platelets 150 - 400 K/uL 260  310  298       Latest Ref Rng & Units 06/14/2022    9:57 PM 05/25/2022    2:27 PM 12/31/2021    6:08 PM  CMP  Glucose 70 - 99 mg/dL 92  85  94  BUN 6 - 20 mg/dL '15  15  13   '$ Creatinine 0.44 - 1.00 mg/dL 0.80  0.95  0.91   Sodium 135 - 145 mmol/L 137  139  136   Potassium 3.5 - 5.1 mmol/L 3.9  4.6  3.9   Chloride 98 - 111 mmol/L 106  109  104   CO2 22 - 32 mmol/L '23  24  23   '$ Calcium 8.9 - 10.3 mg/dL 9.0  9.2  9.2   Total Protein 6.5 - 8.1 g/dL  7.7  7.9   Total Bilirubin 0.3 - 1.2 mg/dL  1.4  0.8   Alkaline Phos 38 - 126 U/L  62  64   AST 15 - 41 U/L  28  26   ALT 0 - 44 U/L  12  15    *** {Labs :18171}  Imaging: Radiological images reviewed:  CLINICAL DATA:  Right-sided abdominal pain.   EXAM: CT ABDOMEN AND PELVIS WITH CONTRAST   TECHNIQUE: Multidetector CT imaging of the abdomen and pelvis was performed using the standard protocol following  bolus administration of intravenous contrast.   RADIATION DOSE REDUCTION: This exam was performed according to the departmental dose-optimization program which includes automated exposure control, adjustment of the mA and/or kV according to patient size and/or use of iterative reconstruction technique.   CONTRAST:  13m OMNIPAQUE IOHEXOL 350 MG/ML SOLN   COMPARISON:  Dec 31, 2021 and August 26, 2015   FINDINGS: Lower chest: No acute abnormality.   Hepatobiliary: No focal liver abnormality is seen. Status post cholecystectomy. No biliary dilatation.   Pancreas: Unremarkable. No pancreatic ductal dilatation or surrounding inflammatory changes.   Spleen: Normal in size without focal abnormality.   Adrenals/Urinary Tract: Adrenal glands are unremarkable. Kidneys are normal, without renal calculi, focal lesion, or hydronephrosis. Mild diffuse urinary bladder wall thickening is noted. A mild amount of surrounding inflammatory fat stranding is seen.   Stomach/Bowel: Stomach is within normal limits. Appendix appears normal. No evidence of bowel wall thickening, distention, or inflammatory changes.   A stable, likely benign 2.6 cm x 1.9 cm x 2.3 cm area of low attenuation, with small central hyperdense focus, is seen within the anterior aspect of the pelvis on the right (axial CT images 58 through 62, CT series 2). This is stable as far back as August 26, 2015.   Vascular/Lymphatic: No significant vascular findings are present. No enlarged abdominal or pelvic lymph nodes.   Reproductive: The uterus is heterogeneous in appearance and contains multiple small soft tissue lesions. The bilateral adnexa are unremarkable.   Other: A 2.4 cm x 1.9 cm right-sided fat containing paraumbilical hernia is seen. This is stable in size when compared to the prior study. A mild-to-moderate amount of inflammatory fat stranding is seen within this region on the current exam (axial CT image 35,  CT series 2).   A stable 3.0 cm x 1.8 cm fat containing left-sided para umbilical hernia is noted.   No abdominopelvic ascites.   Musculoskeletal: No acute or significant osseous findings.   IMPRESSION: 1. Findings consistent with mild cystitis. Correlation with urinalysis is recommended. 2. Bilateral fat containing paraumbilical hernias, with associated inflammatory stranding seen on the right. Correlation with physical examination is recommended. 3. Findings likely consistent with multiple small uterine fibroids. Correlation with nonemergent pelvic ultrasound is recommended. 4. Prior cholecystectomy.     Electronically Signed   By: TVirgina NorfolkM.D.   On: 05/25/2022 16:55   Within last 24 hrs: No  results found.  Assessment    *** There are no problems to display for this patient.   Plan    ***  Face-to-face time spent with the patient and accompanying care providers(if present) was *** minutes, with more than 50% of the time spent counseling, educating, and coordinating care of the patient.    These notes generated with voice recognition software. I apologize for typographical errors.  Ronny Bacon M.D., FACS 07/06/2022, 12:41 PM

## 2022-07-10 ENCOUNTER — Emergency Department
Admission: EM | Admit: 2022-07-10 | Discharge: 2022-07-10 | Discharge status: Against Medical Advice | Payer: Self-pay | Source: Home / Self Care

## 2022-08-15 ENCOUNTER — Encounter: Payer: Self-pay | Admitting: Emergency Medicine

## 2022-08-15 ENCOUNTER — Other Ambulatory Visit: Payer: Self-pay

## 2022-08-15 ENCOUNTER — Emergency Department
Admission: EM | Admit: 2022-08-15 | Discharge: 2022-08-15 | Disposition: A | Discharge status: Home / Self Care | Payer: Self-pay | Attending: Emergency Medicine | Admitting: Emergency Medicine

## 2022-08-15 DIAGNOSIS — R519 Headache, unspecified: Secondary | ICD-10-CM | POA: Insufficient documentation

## 2022-08-15 DIAGNOSIS — M7918 Myalgia, other site: Secondary | ICD-10-CM | POA: Insufficient documentation

## 2022-08-15 DIAGNOSIS — Z20822 Contact with and (suspected) exposure to covid-19: Secondary | ICD-10-CM | POA: Insufficient documentation

## 2022-08-15 DIAGNOSIS — R509 Fever, unspecified: Secondary | ICD-10-CM | POA: Insufficient documentation

## 2022-08-15 DIAGNOSIS — J111 Influenza due to unidentified influenza virus with other respiratory manifestations: Secondary | ICD-10-CM

## 2022-08-15 DIAGNOSIS — R059 Cough, unspecified: Secondary | ICD-10-CM | POA: Insufficient documentation

## 2022-08-15 LAB — RESP PANEL BY RT-PCR (RSV, FLU A&B, COVID)  RVPGX2
Influenza A by PCR: NEGATIVE
Influenza B by PCR: NEGATIVE
Resp Syncytial Virus by PCR: NEGATIVE
SARS Coronavirus 2 by RT PCR: NEGATIVE

## 2022-08-15 MED ORDER — ACETAMINOPHEN 500 MG PO TABS
1000.0000 mg | ORAL_TABLET | Freq: Once | ORAL | Status: AC
  Administered 2022-08-15: 1000 mg via ORAL
  Filled 2022-08-15: qty 2

## 2022-08-15 MED ORDER — PSEUDOEPH-BROMPHEN-DM 30-2-10 MG/5ML PO SYRP: 10.0000 mL | ORAL_SOLUTION | Freq: Four times a day (QID) | ORAL | 0 refills | Status: AC | PRN

## 2022-08-15 MED ORDER — IBUPROFEN 800 MG PO TABS
800.0000 mg | ORAL_TABLET | Freq: Once | ORAL | Status: AC
  Administered 2022-08-15: 800 mg via ORAL
  Filled 2022-08-15: qty 1

## 2022-08-15 MED ORDER — BENZONATATE 100 MG PO CAPS: 100.0000 mg | ORAL_CAPSULE | Freq: Three times a day (TID) | ORAL | 0 refills | Status: AC | PRN

## 2022-08-15 MED ORDER — ALBUTEROL SULFATE HFA 108 (90 BASE) MCG/ACT IN AERS: 2.0000 | INHALATION_SPRAY | Freq: Four times a day (QID) | RESPIRATORY_TRACT | 0 refills | Status: AC | PRN

## 2022-08-15 NOTE — ED Triage Notes (Signed)
Pt reports for last 3 days has had cough, congestion, general bodyaches, chills and a headache.

## 2022-08-15 NOTE — ED Notes (Signed)
Pt states that she is [redacted] weeks pregnant, instructed to take all prescribed medication excluding the cough syrup

## 2022-08-15 NOTE — ED Provider Notes (Signed)
[facility] Provider Note  Patient Contact: 6:16 PM (approximate)   History   Cough, Fever, Chills, and Generalized Body Aches   HPI  Kelsey Maynard is a 46 y.o. female who presents the emergency department complaining headache, chills, body aches, cough.  Patient has had symptoms x 3 days.  No significant nasal congestion or sore throat.  Patient with no difficulty breathing.  No unilateral weakness, slurred speech.  Patient does arrive with a fever.     Physical Exam   Triage Vital Signs: ED Triage Vitals  Enc Vitals Group     BP 08/15/22 1625 127/84     Pulse Rate 08/15/22 1625 81     Resp 08/15/22 1625 20     Temp 08/15/22 1625 100.3 F (37.9 C)     Temp Source 08/15/22 1625 Oral     SpO2 08/15/22 1625 96 %     Weight 08/15/22 1621 198 lb 6.6 oz (90 kg)     Height 08/15/22 1621 '5\' 8"'$  (1.727 m)     Head Circumference --      Peak Flow --      Pain Score 08/15/22 1621 8     Pain Loc --      Pain Edu? --      Excl. in [place]? --     Most recent vital signs: Vitals:   08/15/22 1625  BP: 127/84  Pulse: 81  Resp: 20  Temp: 100.3 F (37.9 C)  SpO2: 96%     General: Alert and in no acute distress. Eyes:  PERRL. EOMI. ENT:      Ears:       Nose: No congestion/rhinnorhea.      Mouth/Throat: Mucous membranes are moist. Neck: No stridor. No cervical spine tenderness to palpation.  Cardiovascular:  Good peripheral perfusion Respiratory: Normal respiratory effort without tachypnea or retractions. Lungs CTAB. Good air entry to the bases with no decreased or absent breath sounds. Musculoskeletal: Full range of motion to all extremities.  Neurologic:  No gross focal neurologic deficits are appreciated.  Skin:   No rash noted Other:   ED Results / Procedures / Treatments   Labs (all labs ordered are listed, but only abnormal results are displayed) Labs Reviewed  RESP PANEL BY RT-PCR (RSV, FLU A&B, COVID)  [id number]      EKG     RADIOLOGY    No results found.  PROCEDURES:  Critical Care performed: No  Procedures   MEDICATIONS ORDERED IN ED: Medications  acetaminophen (TYLENOL) tablet 1,000 mg (has no administration in time range)  ibuprofen (ADVIL) tablet 800 mg (has no administration in time range)     IMPRESSION / MDM / ASSESSMENT AND PLAN / ED COURSE  I reviewed the triage vital signs and the nursing notes.                              Differential diagnosis includes, but is not limited to, viral illness, COVID, flu, RSV  Patient's presentation is most consistent with acute presentation with potential threat to life or bodily function.   Patient's diagnosis is consistent with influenza-like virus.  Patient presents emergency department complaining of multiple symptoms consistent with flu.  Patient did have a negative test, however given the community prevalence currently with symptoms I suspect that she does in fact have flu.  Patient will have Tylenol, Motrin, additional medications for cough.  Follow-up with primary care as  needed.. Patient is given ED precautions to return to the ED for any worsening or new symptoms.       FINAL CLINICAL IMPRESSION(S) / ED DIAGNOSES   Final diagnoses:  Influenza-like illness     Rx / DC Orders   ED Discharge Orders          Ordered    albuterol (VENTOLIN HFA) 108 (90 Base) MCG/ACT inhaler  Every 6 hours PRN        08/15/22 1819    benzonatate (TESSALON PERLES) 100 MG capsule  3 times daily PRN        08/15/22 1819    brompheniramine-pseudoephedrine-DM 30-2-10 MG/5ML syrup  4 times daily PRN        08/15/22 1819             Note:  This document was prepared using Dragon voice recognition software and may include unintentional dictation errors.   Darletta Moll, PA-C 08/15/22 1820    Rada Hay, MD 08/15/22 [phone]

## 2022-09-09 ENCOUNTER — Emergency Department
Admission: EM | Admit: 2022-09-09 | Discharge: 2022-09-09 | Discharge status: Against Medical Advice | Payer: Self-pay | Attending: Emergency Medicine | Admitting: Emergency Medicine

## 2022-09-09 DIAGNOSIS — R070 Pain in throat: Secondary | ICD-10-CM | POA: Insufficient documentation

## 2022-09-09 DIAGNOSIS — Z5321 Procedure and treatment not carried out due to patient leaving prior to being seen by health care provider: Secondary | ICD-10-CM | POA: Insufficient documentation

## 2022-09-09 NOTE — ED Notes (Signed)
Registration attempted to call pt's phone as well without answer.

## 2022-10-09 ENCOUNTER — Other Ambulatory Visit: Payer: Self-pay

## 2022-10-09 DIAGNOSIS — G8918 Other acute postprocedural pain: Secondary | ICD-10-CM | POA: Insufficient documentation

## 2022-10-09 DIAGNOSIS — R109 Unspecified abdominal pain: Secondary | ICD-10-CM | POA: Insufficient documentation

## 2022-10-09 LAB — COMPREHENSIVE METABOLIC PANEL
ALT: 13 U/L (ref 0–44)
AST: 20 U/L (ref 15–41)
Albumin: 4.3 g/dL (ref 3.5–5.0)
Alkaline Phosphatase: 64 U/L (ref 38–126)
Anion gap: 6 (ref 5–15)
BUN: 20 mg/dL (ref 6–20)
CO2: 23 mmol/L (ref 22–32)
Calcium: 9.2 mg/dL (ref 8.9–10.3)
Chloride: 108 mmol/L (ref 98–111)
Creatinine, Ser: 0.96 mg/dL (ref 0.44–1.00)
GFR, Estimated: >60 mL/min (ref >60)
Glucose, Bld: 102 mg/dL — ABNORMAL HIGH (ref 70–99)
Potassium: 3.7 mmol/L (ref 3.5–5.1)
Sodium: 137 mmol/L (ref 135–145)
Total Bilirubin: 0.9 mg/dL (ref 0.3–1.2)
Total Protein: 7.7 g/dL (ref 6.5–8.1)

## 2022-10-09 LAB — LIPASE, BLOOD: Lipase: 39 U/L (ref 11–51)

## 2022-10-09 LAB — CBC
HCT: 38.2 % (ref 36.0–46.0)
Hemoglobin: 13.1 g/dL (ref 12.0–15.0)
MCH: 33.3 pg (ref 26.0–34.0)
MCHC: 34.3 g/dL (ref 30.0–36.0)
MCV: 97.2 fL (ref 80.0–100.0)
Platelets: 328 10*3/uL (ref 150–400)
RBC: 3.93 MIL/uL (ref 3.87–5.11)
RDW: 12.4 % (ref 11.5–15.5)
WBC: 7.2 10*3/uL (ref 4.0–10.5)
nRBC: 0 % (ref 0.0–0.2)

## 2022-10-09 NOTE — ED Triage Notes (Signed)
Pt to ED from home for abdominal pain. Pt had a D&C 3 days ago in [place]. Pt states "I am in a lot of pain and the hospital in [place] didn't give me any pain meds". Pt is CAOx4 and in no acute distress at this time and ambulatory in triage.

## 2022-10-10 ENCOUNTER — Emergency Department
Admission: EM | Admit: 2022-10-10 | Discharge: 2022-10-10 | Disposition: A | Discharge status: Home / Self Care | Payer: Self-pay | Attending: Emergency Medicine | Admitting: Emergency Medicine

## 2022-10-10 DIAGNOSIS — G8918 Other acute postprocedural pain: Secondary | ICD-10-CM

## 2022-10-10 LAB — URINALYSIS, ROUTINE W REFLEX MICROSCOPIC
Bacteria, UA: NONE SEEN
Bilirubin Urine: NEGATIVE
Glucose, UA: NEGATIVE mg/dL
Ketones, ur: NEGATIVE mg/dL
Leukocytes,Ua: NEGATIVE
Nitrite: NEGATIVE
Protein, ur: 30 mg/dL — AB
Specific Gravity, Urine: 1.033 — ABNORMAL HIGH (ref 1.005–1.030)
pH: 5 (ref 5.0–8.0)

## 2022-10-10 MED ORDER — OXYCODONE HCL 5 MG PO TABS: 5.0000 mg | ORAL_TABLET | Freq: Four times a day (QID) | ORAL | 0 refills | Status: AC | PRN

## 2022-10-10 MED ORDER — ACETAMINOPHEN 500 MG PO TABS
1000.0000 mg | ORAL_TABLET | Freq: Once | ORAL | Status: AC
  Administered 2022-10-10: 1000 mg via ORAL
  Filled 2022-10-10: qty 2

## 2022-10-10 MED ORDER — KETOROLAC TROMETHAMINE 30 MG/ML IJ SOLN
30.0000 mg | Freq: Once | INTRAMUSCULAR | Status: DC
  Filled 2022-10-10: qty 1

## 2022-10-10 MED ORDER — OXYCODONE HCL 5 MG PO TABS
5.0000 mg | ORAL_TABLET | Freq: Once | ORAL | Status: AC
  Administered 2022-10-10: 5 mg via ORAL
  Filled 2022-10-10: qty 1

## 2022-10-10 NOTE — ED Provider Notes (Signed)
[facility] Provider Note    Event Date/Time   First MD Initiated Contact with Patient 10/10/22 0111     (approximate)   History   Abdominal Pain   HPI  Kelsey Maynard is a 47 y.o. female who presents to the ED for evaluation of Abdominal Pain   Patient presents to the ED for evaluation of postop pain.  She reports that she had a D&C due to a miscarriage in her hometown in [place] 3 days ago.  They "did not tell me anything or give me any prescriptions."  She reports her vaginal bleeding has been tapering off and improving, now it is similar to a "normal period."  She has been using ibuprofen with some improvement of her symptoms, but with persistent cramping and pain.  No fevers or other vaginal discharge.  Bleeding is subsiding and not worsening.  No syncope, emesis, stool changes or other concerns.  She reports that she has never had a D&C before and did not know what kind of pain was normal or what to expect and wanted to get checked out  Physical Exam   Triage Vital Signs: ED Triage Vitals  Enc Vitals Group     BP 10/09/22 2315 (!) 129/96     Pulse Rate 10/09/22 2315 100     Resp 10/09/22 2315 16     Temp 10/09/22 2315 98.5 F (36.9 C)     Temp Source 10/09/22 2315 Oral     SpO2 10/09/22 2315 98 %     Weight 10/09/22 2316 293 lb (132.9 kg)     Height 10/09/22 2316 5' 8"$  (1.727 m)     Head Circumference --      Peak Flow --      Pain Score 10/09/22 2315 10     Pain Loc --      Pain Edu? --      Excl. in [place]? --     Most recent vital signs: Vitals:   10/09/22 2315  BP: (!) 129/96  Pulse: 100  Resp: 16  Temp: 98.5 F (36.9 C)  SpO2: 98%    General: Awake, no distress.  CV:  Good peripheral perfusion.  Resp:  Normal effort.  Abd:  No distention.  Soft.  Minimal suprapubic tenderness without guarding or peritoneal features. MSK:  No deformity noted.  Neuro:  No focal deficits appreciated. Other:     ED Results / Procedures /  Treatments   Labs (all labs ordered are listed, but only abnormal results are displayed) Labs Reviewed  COMPREHENSIVE METABOLIC PANEL - Abnormal; Notable for the following components:      Result Value   Glucose, Bld 102 (*)    All other components within normal limits  URINALYSIS, ROUTINE W REFLEX MICROSCOPIC - Abnormal; Notable for the following components:   Color, Urine AMBER (*)    APPearance HAZY (*)    Specific Gravity, Urine 1.033 (*)    Hgb urine dipstick SMALL (*)    Protein, ur 30 (*)    All other components within normal limits  LIPASE, BLOOD  CBC  POC URINE PREG, ED    EKG   RADIOLOGY   Official radiology report(s): No results found.  PROCEDURES and INTERVENTIONS:  Procedures  Medications - No data to display   IMPRESSION / MDM / [place] / ED COURSE  I reviewed the triage vital signs and the nursing notes.  Differential diagnosis includes, but is not limited to, appropriate postop  pain, retained products of conception, cystitis, blood loss anemia  {Patient presents with symptoms of an acute illness or injury that is potentially life-threatening.  47 year old woman presents to the ED with postop pain after a recent uterine D&C in the setting of a miscarriage, with what appears to be appropriate postop pain suitable for continued outpatient management.  Look systemically well to me and has a benign abdominal examination.  Her blood work shows normal hemoglobin and no leukocytosis.  I doubt sepsis or retained products.  Lipase, metabolic panel are normal.  Urine without infectious features and she has no urinary symptoms.  Will provide multimodal analgesia and discharged with the same.  We discussed expectant management and return precautions.      FINAL CLINICAL IMPRESSION(S) / ED DIAGNOSES   Final diagnoses:  None     Rx / DC Orders   ED Discharge Orders     None        Note:  This document was prepared using Dragon voice  recognition software and may include unintentional dictation errors.   Vladimir Crofts, MD 10/10/22 [phone]

## 2022-10-10 NOTE — Discharge Instructions (Addendum)
Please take Tylenol and ibuprofen/Advil for your pain.  It is safe to take them together, or to alternate them every few hours.  Take up to 1053m of Tylenol at a time, up to 4 times per day.  Do not take more than 4000 mg of Tylenol in 24 hours.  For ibuprofen, take 400-600 mg, 3 - 4 times per day..  Use the oxycodone for more severe/breakthrough pain.  If you develop worsening pain despite this, fevers or other worsening symptoms then please return to the ED

## 2022-10-18 DIAGNOSIS — Z885 Allergy status to narcotic agent status: Secondary | ICD-10-CM | POA: Diagnosis not present

## 2022-10-18 DIAGNOSIS — M542 Cervicalgia: Secondary | ICD-10-CM | POA: Diagnosis not present

## 2022-10-18 DIAGNOSIS — Z888 Allergy status to other drugs, medicaments and biological substances status: Secondary | ICD-10-CM | POA: Diagnosis not present

## 2022-10-18 DIAGNOSIS — Z881 Allergy status to other antibiotic agents status: Secondary | ICD-10-CM | POA: Diagnosis not present

## 2022-10-18 DIAGNOSIS — S161XXA Strain of muscle, fascia and tendon at neck level, initial encounter: Secondary | ICD-10-CM | POA: Diagnosis not present

## 2022-10-18 DIAGNOSIS — R519 Headache, unspecified: Secondary | ICD-10-CM | POA: Diagnosis not present

## 2022-10-28 DIAGNOSIS — M5459 Other low back pain: Secondary | ICD-10-CM | POA: Diagnosis not present

## 2022-10-28 DIAGNOSIS — R509 Fever, unspecified: Secondary | ICD-10-CM | POA: Diagnosis not present

## 2022-10-28 DIAGNOSIS — Z881 Allergy status to other antibiotic agents status: Secondary | ICD-10-CM | POA: Diagnosis not present

## 2022-10-28 DIAGNOSIS — Z20822 Contact with and (suspected) exposure to covid-19: Secondary | ICD-10-CM | POA: Diagnosis not present

## 2022-10-28 DIAGNOSIS — Z888 Allergy status to other drugs, medicaments and biological substances status: Secondary | ICD-10-CM | POA: Diagnosis not present

## 2022-10-28 DIAGNOSIS — M545 Low back pain, unspecified: Secondary | ICD-10-CM | POA: Diagnosis not present

## 2022-10-28 DIAGNOSIS — Y92410 Unspecified street and highway as the place of occurrence of the external cause: Secondary | ICD-10-CM | POA: Diagnosis not present

## 2022-10-28 DIAGNOSIS — J45909 Unspecified asthma, uncomplicated: Secondary | ICD-10-CM | POA: Diagnosis not present

## 2022-10-28 DIAGNOSIS — Z885 Allergy status to narcotic agent status: Secondary | ICD-10-CM | POA: Diagnosis not present

## 2022-10-28 DIAGNOSIS — Z9049 Acquired absence of other specified parts of digestive tract: Secondary | ICD-10-CM | POA: Diagnosis not present

## 2022-10-28 DIAGNOSIS — Z1152 Encounter for screening for COVID-19: Secondary | ICD-10-CM | POA: Diagnosis not present

## 2023-03-01 ENCOUNTER — Ambulatory Visit: Payer: Self-pay | Admitting: Family
